# Patient Record
Sex: Male | Born: 1935 | Race: White | Hispanic: No | Smoking: Former smoker
Health system: Southern US, Community
[De-identification: ages and names within clinical notes are randomized; demographics above are authoritative.]

## PROBLEM LIST (undated history)

## (undated) DIAGNOSIS — E079 Disorder of thyroid, unspecified: Secondary | ICD-10-CM

## (undated) DIAGNOSIS — C801 Malignant (primary) neoplasm, unspecified: Secondary | ICD-10-CM

## (undated) DIAGNOSIS — I10 Essential (primary) hypertension: Secondary | ICD-10-CM

## (undated) DIAGNOSIS — H9190 Unspecified hearing loss, unspecified ear: Secondary | ICD-10-CM

## (undated) DIAGNOSIS — E039 Hypothyroidism, unspecified: Secondary | ICD-10-CM

## (undated) DIAGNOSIS — M1990 Unspecified osteoarthritis, unspecified site: Secondary | ICD-10-CM

## (undated) HISTORY — PX: PROSTATE SURGERY: SHX751

## (undated) HISTORY — PX: CHOLECYSTECTOMY: SHX55

---

## 2009-06-14 ENCOUNTER — Ambulatory Visit: Payer: Self-pay | Admitting: Urology

## 2009-06-29 ENCOUNTER — Ambulatory Visit: Payer: Self-pay | Admitting: Urology

## 2010-09-27 ENCOUNTER — Ambulatory Visit: Payer: Self-pay | Admitting: Urology

## 2010-10-04 LAB — PATHOLOGY REPORT

## 2012-05-01 DIAGNOSIS — N400 Enlarged prostate without lower urinary tract symptoms: Secondary | ICD-10-CM | POA: Insufficient documentation

## 2013-11-05 DIAGNOSIS — E785 Hyperlipidemia, unspecified: Secondary | ICD-10-CM | POA: Insufficient documentation

## 2013-11-05 DIAGNOSIS — N289 Disorder of kidney and ureter, unspecified: Secondary | ICD-10-CM | POA: Insufficient documentation

## 2013-11-05 DIAGNOSIS — I10 Essential (primary) hypertension: Secondary | ICD-10-CM | POA: Insufficient documentation

## 2013-11-05 DIAGNOSIS — E039 Hypothyroidism, unspecified: Secondary | ICD-10-CM | POA: Insufficient documentation

## 2013-11-05 DIAGNOSIS — E038 Other specified hypothyroidism: Secondary | ICD-10-CM | POA: Insufficient documentation

## 2015-03-03 DIAGNOSIS — Z87448 Personal history of other diseases of urinary system: Secondary | ICD-10-CM | POA: Insufficient documentation

## 2015-05-11 DIAGNOSIS — E034 Atrophy of thyroid (acquired): Secondary | ICD-10-CM | POA: Insufficient documentation

## 2016-06-25 DIAGNOSIS — R319 Hematuria, unspecified: Secondary | ICD-10-CM | POA: Insufficient documentation

## 2016-07-02 ENCOUNTER — Other Ambulatory Visit: Payer: Self-pay | Admitting: Internal Medicine

## 2016-07-02 ENCOUNTER — Ambulatory Visit
Admission: RE | Admit: 2016-07-02 | Discharge: 2016-07-02 | Disposition: A | Discharge status: Home / Self Care | Payer: Medicare Other | Source: Ambulatory Visit | Attending: Internal Medicine | Admitting: Internal Medicine

## 2016-07-02 DIAGNOSIS — R600 Localized edema: Secondary | ICD-10-CM | POA: Diagnosis not present

## 2016-07-24 ENCOUNTER — Encounter: Payer: Self-pay | Admitting: Emergency Medicine

## 2016-07-24 ENCOUNTER — Emergency Department
Admission: EM | Admit: 2016-07-24 | Discharge: 2016-07-24 | Disposition: A | Discharge status: Home / Self Care | Payer: Medicare Other | Attending: Emergency Medicine | Admitting: Emergency Medicine

## 2016-07-24 DIAGNOSIS — I10 Essential (primary) hypertension: Secondary | ICD-10-CM | POA: Diagnosis not present

## 2016-07-24 DIAGNOSIS — R339 Retention of urine, unspecified: Secondary | ICD-10-CM

## 2016-07-24 DIAGNOSIS — N3091 Cystitis, unspecified with hematuria: Secondary | ICD-10-CM | POA: Diagnosis not present

## 2016-07-24 DIAGNOSIS — Z79899 Other long term (current) drug therapy: Secondary | ICD-10-CM | POA: Diagnosis not present

## 2016-07-24 DIAGNOSIS — N3090 Cystitis, unspecified without hematuria: Secondary | ICD-10-CM

## 2016-07-24 DIAGNOSIS — R310 Gross hematuria: Secondary | ICD-10-CM

## 2016-07-24 HISTORY — DX: Disorder of thyroid, unspecified: E07.9

## 2016-07-24 HISTORY — DX: Essential (primary) hypertension: I10

## 2016-07-24 LAB — URINALYSIS, COMPLETE (UACMP) WITH MICROSCOPIC
BACTERIA UA: NONE SEEN
Squamous Epithelial / LPF: NONE SEEN

## 2016-07-24 LAB — COMPREHENSIVE METABOLIC PANEL
ALT: 13 U/L — AB (ref 17–63)
ANION GAP: 9 (ref 5–15)
AST: 33 U/L (ref 15–41)
Albumin: 3.5 g/dL (ref 3.5–5.0)
Alkaline Phosphatase: 46 U/L (ref 38–126)
BUN: 19 mg/dL (ref 6–20)
CHLORIDE: 98 mmol/L — AB (ref 101–111)
CO2: 22 mmol/L (ref 22–32)
Calcium: 8.6 mg/dL — ABNORMAL LOW (ref 8.9–10.3)
Creatinine, Ser: 1.19 mg/dL (ref 0.61–1.24)
GFR, EST NON AFRICAN AMERICAN: 55 mL/min — AB (ref >60)
Glucose, Bld: 121 mg/dL — ABNORMAL HIGH (ref 65–99)
Potassium: 3.6 mmol/L (ref 3.5–5.1)
Sodium: 129 mmol/L — ABNORMAL LOW (ref 135–145)
Total Bilirubin: 1.3 mg/dL — ABNORMAL HIGH (ref 0.3–1.2)
Total Protein: 6.2 g/dL — ABNORMAL LOW (ref 6.5–8.1)

## 2016-07-24 LAB — CBC
HEMATOCRIT: 27.7 % — AB (ref 40.0–52.0)
Hemoglobin: 9.2 g/dL — ABNORMAL LOW (ref 13.0–18.0)
MCH: 29.8 pg (ref 26.0–34.0)
MCHC: 33.2 g/dL (ref 32.0–36.0)
MCV: 89.6 fL (ref 80.0–100.0)
Platelets: 193 10*3/uL (ref 150–440)
RBC: 3.09 MIL/uL — AB (ref 4.40–5.90)
RDW: 14.2 % (ref 11.5–14.5)
WBC: 7.7 10*3/uL (ref 3.8–10.6)

## 2016-07-24 MED ORDER — CEPHALEXIN 500 MG PO CAPS: 500.0000 mg | ORAL_CAPSULE | Freq: Four times a day (QID) | ORAL | 0 refills | Status: AC

## 2016-07-24 MED ORDER — SODIUM CHLORIDE 0.9 % IV BOLUS (SEPSIS)
500.0000 mL | Freq: Once | INTRAVENOUS | Status: AC
  Administered 2016-07-24: 500 mL via INTRAVENOUS

## 2016-07-24 MED ORDER — CEFTRIAXONE SODIUM-DEXTROSE 1-3.74 GM-% IV SOLR
1.0000 g | Freq: Once | INTRAVENOUS | Status: AC
  Administered 2016-07-24: 1 g via INTRAVENOUS
  Filled 2016-07-24: qty 50

## 2016-07-24 MED ORDER — DEXTROSE 5 % IV SOLN
1.0000 g | Freq: Once | INTRAVENOUS | Status: DC
  Filled 2016-07-24: qty 10

## 2016-07-24 NOTE — ED Provider Notes (Signed)
[facility] Emergency Department Provider Note   ____________________________________________   First MD Initiated Contact with Patient 07/24/16 0028     (approximate)  I have reviewed the triage vital signs and the nursing notes.   HISTORY  Chief Complaint Hematuria    HPI Nicholas Grimes is a 83 y.o. male who comes into the hospital today with hematuria. The patient had a catheter for a week after having some hematuria a few weeks ago. He reports that after seeing the urologist's office he was given the option to remove the catheter and to self catheter. The patient has been self cathetering but reports today he noticed some blood coming from his catheter. He reports that he is also having pain in his abdomen as well as in his penis. He it hurts when he urinates very little comes out. He feels as though he has to urinate. The patient called his nephew tonight. He was seen at [facility] 3 weeks ago and he had a procedure to help cauterize the bleeding. Tonight after the patient contacted his nephew he has been dribbling blood. The nephew didn't feel like he could bring the patient in for himself so he called 911. The patient has been seen at Dr. Dene Gentry office.He was scheduled for a bladder scan in the morning. The patient is here today for evaluation. The patient has otherwise been doing well. He has no chest pain or shortness of breath, he has no dizziness or lightheadedness. He rates his pain 8-9 out of 10 in intensity.   Past Medical History:  Diagnosis Date  . Hypertension   . Thyroid disease     There are no active problems to display for this patient.   Past Surgical History:  Procedure Laterality Date  . CHOLECYSTECTOMY    . PROSTATE SURGERY      Prior to Admission medications   Medication Sig Start Date End Date Taking? Authorizing Provider  doxazosin (CARDURA) 8 MG tablet Take 8 mg by mouth every evening.   Yes Historical Provider,  MD  hydrochlorothiazide (HYDRODIURIL) 25 MG tablet Take 25 mg by mouth daily.   Yes Historical Provider, MD  levothyroxine (SYNTHROID, LEVOTHROID) 75 MCG tablet Take 75 mcg by mouth daily.   Yes Historical Provider, MD  simvastatin (ZOCOR) 20 MG tablet Take 20 mg by mouth every evening.   Yes Historical Provider, MD  cephALEXin (KEFLEX) 500 MG capsule Take 1 capsule (500 mg total) by mouth 4 (four) times daily. 07/24/16 08/03/16  Loney Hering, MD    Allergies Patient has no known allergies.  No family history on file.  Social History Social History  Substance Use Topics  . Smoking status: Former Research scientist (life sciences)  . Smokeless tobacco: Former Systems developer  . Alcohol use No    Review of Systems Constitutional: No fever/chills Eyes: No visual changes. ENT: No sore throat. Cardiovascular: Denies chest pain. Respiratory: Denies shortness of breath. Gastrointestinal: abdominal pain.  No nausea, no vomiting.  No diarrhea.  No constipation. Genitourinary: Hematuria, dysuria, urgency Musculoskeletal: Negative for back pain. Skin: Negative for rash. Neurological: Negative for headaches, focal weakness or numbness.  10-point ROS otherwise negative.  ____________________________________________   PHYSICAL EXAM:  VITAL SIGNS: ED Triage Vitals  Enc Vitals Group     BP 07/24/16 0021 (!) 150/88     Pulse --      Resp 07/24/16 0021 18     Temp 07/24/16 0021 98.9 F (37.2 C)     Temp Source 07/24/16  0021 Oral     SpO2 07/24/16 0021 97 %     Weight 07/24/16 0022 174 lb (78.9 kg)     Height 07/24/16 0022 5\' 11"  (1.803 m)     Head Circumference --      Peak Flow --      Pain Score 07/24/16 0044 8     Pain Loc --      Pain Edu? --      Excl. in [place]? --     Constitutional: Alert and oriented. Well appearing and in Moderate distress. Eyes: Conjunctivae are normal. PERRL. EOMI. Head: Atraumatic. Nose: No congestion/rhinnorhea. Mouth/Throat: Mucous membranes are moist.  Oropharynx  non-erythematous. Cardiovascular: Normal rate, regular rhythm. Grossly normal heart sounds.  Good peripheral circulation. Respiratory: Normal respiratory effort.  No retractions. Lungs CTAB. Gastrointestinal: Soft with some suprapubic tenderness to palpation No distention. Positive bowel sounds Musculoskeletal: No lower extremity tenderness nor edema.  Neurologic:  Normal speech and language.  Skin:  Skin is warm, dry and intact.  Psychiatric: Mood and affect are normal.   ____________________________________________   LABS (all labs ordered are listed, but only abnormal results are displayed)  Labs Reviewed  URINALYSIS, COMPLETE (UACMP) WITH MICROSCOPIC - Abnormal; Notable for the following:       Result Value   Color, Urine RED (*)    APPearance CLOUDY (*)    Specific Gravity, Urine >1.030 (*)    Glucose, UA   (*)    Value: TEST NOT REPORTED DUE TO COLOR INTERFERENCE OF URINE PIGMENT   Hgb urine dipstick   (*)    Value: TEST NOT REPORTED DUE TO COLOR INTERFERENCE OF URINE PIGMENT   Bilirubin Urine   (*)    Value: TEST NOT REPORTED DUE TO COLOR INTERFERENCE OF URINE PIGMENT   Ketones, ur   (*)    Value: TEST NOT REPORTED DUE TO COLOR INTERFERENCE OF URINE PIGMENT   Protein, ur   (*)    Value: TEST NOT REPORTED DUE TO COLOR INTERFERENCE OF URINE PIGMENT   Nitrite   (*)    Value: TEST NOT REPORTED DUE TO COLOR INTERFERENCE OF URINE PIGMENT   Leukocytes, UA   (*)    Value: TEST NOT REPORTED DUE TO COLOR INTERFERENCE OF URINE PIGMENT   All other components within normal limits  CBC - Abnormal; Notable for the following:    RBC 3.09 (*)    Hemoglobin 9.2 (*)    HCT 27.7 (*)    All other components within normal limits  COMPREHENSIVE METABOLIC PANEL - Abnormal; Notable for the following:    Sodium 129 (*)    Chloride 98 (*)    Glucose, Bld 121 (*)    Calcium 8.6 (*)    Total Protein 6.2 (*)    ALT 13 (*)    Total Bilirubin 1.3 (*)    GFR calc non Af Amer 55 (*)    All  other components within normal limits  URINE CULTURE   ____________________________________________  EKG  none ____________________________________________  RADIOLOGY  none ____________________________________________   PROCEDURES  Procedure(s) performed: None  Procedures  Critical Care performed: No  ____________________________________________   INITIAL IMPRESSION / ASSESSMENT AND PLAN / ED COURSE  Pertinent labs & imaging results that were available during my care of the patient were reviewed by me and considered in my medical decision making (see chart for details).  This is an 83 year old who comes into the hospital today with hematuria. We'll check the patient's urine as well  as a CBC and a CMP. I will review the patient's chart to determine what procedure was done while he was at [facility]. I will reassess the patient. I will have them place another catheter in the patient as well.     The patient had his bladder irrigated. We were able to clear some of the blood from his urine and then it turned back into blood-tinged urine. Since he does have white blood cells and red blood cells in his urine I will treat him for possible infection. As the patient has been performing intermittent catheterization there is a chance that this could be due to infection. The patient has an appointment with his urologist today at 10:30. I feel that he can be discharged to follow-up with his urologist. The patient's hemoglobin is 9.2 with a hematocrit of 27.7. The patient's last hemoglobin was done on 3/13 and it was 9.6 and 29. I discussed this with the patient's nephew who is with him. The patient will be discharged to follow-up today. The patient has been encouraged to drink lots of water to help clear his urine. ____________________________________________   FINAL CLINICAL IMPRESSION(S) / ED DIAGNOSES  Final diagnoses:  Gross hematuria  Cystitis  Urinary retention       NEW MEDICATIONS STARTED DURING THIS VISIT:  New Prescriptions   CEPHALEXIN (KEFLEX) 500 MG CAPSULE    Take 1 capsule (500 mg total) by mouth 4 (four) times daily.     Note:  This document was prepared using Dragon voice recognition software and may include unintentional dictation errors.    Loney Hering, MD 07/24/16 [phone]

## 2016-07-24 NOTE — ED Notes (Signed)
Pt discharged to home.  Family member driving.  Discharge instructions reviewed.  Verbalized understanding.  No questions or concerns at this time.  Teach back verified.  Pt in NAD.  No items left in ED.   

## 2016-07-24 NOTE — ED Notes (Signed)
Leg bag placed on patient, patient verbalized understanding of care of catheter.

## 2016-07-24 NOTE — Discharge Instructions (Signed)
After irrigating your bladder your bloody urine has some improvement. It is not purely blood but is blood-tinged urine. We do have white blood cells and red blood cells in urine at this time. I am treating you for an infection but want you to keep your appointment with your urologist today at 10:30.

## 2016-07-24 NOTE — ED Notes (Signed)
Bladder irrigation performed.  Approximately 60cc irrigated into bladder and 300cc of bright red urine with multiple small blood clots removed from catheter.  Catheter flowing well at this time and patient reporting relief.

## 2016-07-24 NOTE — ED Notes (Signed)
Report given to Iris RN 

## 2016-07-24 NOTE — ED Triage Notes (Signed)
Pt arrived via ems from home with complaints of hematuria and penis pain. Pt reports having a folley placed 3 weeks ago. The folley was removed 1 week ago and pt was instructed to in and out cath himself. Pt reports that has been effective until today.

## 2016-07-26 LAB — URINE CULTURE: Culture: 50000 — AB

## 2016-07-27 NOTE — Progress Notes (Signed)
PHARMACY CONSULT NOTE - INITIAL   Pharmacy Consult for ED Culture Results   No Known Allergies  Patient Measurements: Height: 5\' 11"  (180.3 cm) Weight: 174 lb (78.9 kg) IBW/kg (Calculated) : 75.3   Intake/Output from previous day: No intake/output data recorded. Intake/Output from this shift: No intake/output data recorded.  Labs: No results for input(s): WBC, HGB, HCT, PLT, APTT, CREATININE, LABCREA, CREATININE, CREAT24HRUR, MG, PHOS, ALBUMIN, PROT, ALBUMIN, AST, ALT, ALKPHOS, BILITOT, BILIDIR, IBILI in the last 72 hours. Estimated Creatinine Clearance: 50.1 mL/min (by C-G formula based on SCr of 1.19 mg/dL).   Microbiology: Recent Results (from the past 720 hour(s))  Urine culture     Status: Abnormal   Collection Time: 07/24/16  1:33 AM  Result Value Ref Range Status   Specimen Description URINE, RANDOM  Final   Special Requests NONE  Final   Culture 50,000 COLONIES/mL ENTEROCOCCUS FAECALIS (A)  Final   Report Status 07/26/2016 FINAL  Final   Organism ID, Bacteria ENTEROCOCCUS FAECALIS (A)  Final      Susceptibility   Enterococcus faecalis - MIC*    AMPICILLIN <=2 SENSITIVE Sensitive     LEVOFLOXACIN >=8 RESISTANT Resistant     NITROFURANTOIN <=16 SENSITIVE Sensitive     VANCOMYCIN 1 SENSITIVE Sensitive     * 50,000 COLONIES/mL ENTEROCOCCUS FAECALIS    Medical History: Past Medical History:  Diagnosis Date  . Hypertension   . Thyroid disease     Assessment: 83 yo male seen in ED on 4/10 with cystitis and gross hematuria. Patient was discharged on Keflex 500mg  QID x 10 days. Urine cultures showing 50,000 Enterococcus faecalis.   Plan:  Keflex does not cover Enterococcus faecalis. Based on sensitivity results will prescribe Amoxicillin 500mg  TID x 10 days per Dr. Charlotte Crumb.  Patient was contacted and culture results and treatment plan were discussed. Patient was instructed to discontinue Keflex and begin new antibiotic. Patient verbalized understanding.  RX  was called into patient's pharmacy of choice, [facility] on [address], on 4/13 @ 1350.   Pernell Dupre, PharmD, BCPS Clinical Pharmacist 07/27/2016 2:02 PM

## 2016-10-26 ENCOUNTER — Encounter
Admission: RE | Admit: 2016-10-26 | Discharge: 2016-10-26 | Disposition: A | Discharge status: Home / Self Care | Payer: Medicare Other | Source: Ambulatory Visit | Attending: Surgery | Admitting: Surgery

## 2016-10-26 DIAGNOSIS — I10 Essential (primary) hypertension: Secondary | ICD-10-CM | POA: Insufficient documentation

## 2016-10-26 DIAGNOSIS — K4090 Unilateral inguinal hernia, without obstruction or gangrene, not specified as recurrent: Secondary | ICD-10-CM | POA: Insufficient documentation

## 2016-10-26 DIAGNOSIS — Z01818 Encounter for other preprocedural examination: Secondary | ICD-10-CM | POA: Insufficient documentation

## 2016-10-26 DIAGNOSIS — R001 Bradycardia, unspecified: Secondary | ICD-10-CM | POA: Diagnosis not present

## 2016-10-26 HISTORY — DX: Hypothyroidism, unspecified: E03.9

## 2016-10-26 HISTORY — DX: Unspecified osteoarthritis, unspecified site: M19.90

## 2016-10-26 HISTORY — DX: Malignant (primary) neoplasm, unspecified: C80.1

## 2016-10-26 LAB — CBC
HEMATOCRIT: 37.6 % — AB (ref 40.0–52.0)
HEMOGLOBIN: 12.5 g/dL — AB (ref 13.0–18.0)
MCH: 27.8 pg (ref 26.0–34.0)
MCHC: 33.1 g/dL (ref 32.0–36.0)
MCV: 83.8 fL (ref 80.0–100.0)
Platelets: 190 10*3/uL (ref 150–440)
RBC: 4.49 MIL/uL (ref 4.40–5.90)
RDW: 16.7 % — ABNORMAL HIGH (ref 11.5–14.5)
WBC: 5.8 10*3/uL (ref 3.8–10.6)

## 2016-10-26 LAB — COMPREHENSIVE METABOLIC PANEL
ALT: 15 U/L — AB (ref 17–63)
AST: 22 U/L (ref 15–41)
Albumin: 4 g/dL (ref 3.5–5.0)
Alkaline Phosphatase: 52 U/L (ref 38–126)
Anion gap: 6 (ref 5–15)
BILIRUBIN TOTAL: 0.7 mg/dL (ref 0.3–1.2)
BUN: 15 mg/dL (ref 6–20)
CO2: 28 mmol/L (ref 22–32)
CREATININE: 0.97 mg/dL (ref 0.61–1.24)
Calcium: 9.2 mg/dL (ref 8.9–10.3)
Chloride: 106 mmol/L (ref 101–111)
GFR calc Af Amer: >60 mL/min (ref >60)
GFR calc non Af Amer: >60 mL/min (ref >60)
Glucose, Bld: 98 mg/dL (ref 65–99)
POTASSIUM: 4 mmol/L (ref 3.5–5.1)
Sodium: 140 mmol/L (ref 135–145)
TOTAL PROTEIN: 7 g/dL (ref 6.5–8.1)

## 2016-10-26 LAB — DIFFERENTIAL
Basophils Absolute: 0.1 10*3/uL (ref 0–0.1)
Basophils Relative: 1 %
EOS ABS: 0.1 10*3/uL (ref 0–0.7)
Eosinophils Relative: 2 %
LYMPHS ABS: 0.8 10*3/uL — AB (ref 1.0–3.6)
LYMPHS PCT: 14 %
MONOS PCT: 6 %
Monocytes Absolute: 0.3 10*3/uL (ref 0.2–1.0)
NEUTROS PCT: 77 %
Neutro Abs: 4.5 10*3/uL (ref 1.4–6.5)

## 2016-10-26 NOTE — Pre-Procedure Instructions (Signed)
Dr. Rosey Bath made aware of pre-op EKG and requested medical clearance.

## 2016-10-26 NOTE — Patient Instructions (Signed)
  Your procedure is scheduled on: October 30, 2016 [facility]) Report to Same Day Surgery 2nd floor medical mall ([facility] Entrance-take elevator on left to 2nd floor.  Check in with surgery information desk.) To find out your arrival time please call [phone] between 1PM - 3PM on October 29, 2016 (MONDAY)  Remember: Instructions that are not followed completely may result in serious medical risk, up to and including death, or upon the discretion of your surgeon and anesthesiologist your surgery may need to be rescheduled.    _x___ 1. Do not eat food or drink liquids after midnight. No gum chewing or hard candies                                __x__ 2. No Alcohol for 24 hours before or after surgery.   __x__3. No Smoking for 24 prior to surgery.   ____  4. Bring all medications with you on the day of surgery if instructed.    __x__ 5. Notify your doctor if there is any change in your medical condition     (cold, fever, infections).     Do not wear jewelry, make-up, hairpins, clips or nail polish.  Do not wear lotions, powders, or perfumes.  Do not shave 48 hours prior to surgery. Men may shave face and neck.  Do not bring valuables to the hospital.    [facility] is not responsible for any belongings or valuables.               Contacts, dentures or bridgework may not be worn into surgery.  Leave your suitcase in the car. After surgery it may be brought to your room.  For patients admitted to the hospital, discharge time is determined by your treatment team                       Patients discharged the day of surgery will not be allowed to drive home.  You will need someone to drive you home and stay with you the night of your procedure.    Please read over the following fact sheets that you were given:   [facility] Preparing for Surgery and or MRSA Information   _x___ Take the following medication the morning of surgery with a sip of water :  1.  LEVOTHYROXINE  2.  3.  4.  5.  6.  ____Fleets enema or Magnesium Citrate as directed.   _x___ Use CHG Soap or sage wipes as directed on instruction sheet   ____ Use inhalers on the day of surgery and bring to hospital day of surgery  ____ Stop Metformin and Janumet 2 days prior to surgery.    ____ Take 1/2 of usual insulin dose the night before surgery and none on the morning  surgery    _x___ Follow recommendations from Cardiologist, Pulmonologist or PCP regarding          stopping Aspirin, Coumadin, Pllavix ,Eliquis, Effient, or Pradaxa, and Pletal.  X____Stop Anti-inflammatories such as Advil, Aleve, Ibuprofen, Motrin, Naproxen, Naprosyn, Goodies powders or aspirin products. OK to take Tylenol    _x___ Stop supplements until after surgery.  But may continue Vitamin D, Vitamin B, and multivitamin         ____ Bring C-Pap to the hospital.

## 2016-10-29 MED ORDER — CEFAZOLIN SODIUM-DEXTROSE 2-4 GM/100ML-% IV SOLN
2.0000 g | Freq: Once | INTRAVENOUS | Status: AC
  Administered 2016-10-30: 2 g via INTRAVENOUS

## 2016-10-30 ENCOUNTER — Encounter
Admission: RE | Disposition: A | Discharge status: Home / Self Care | Payer: Self-pay | Source: Ambulatory Visit | Attending: Surgery

## 2016-10-30 ENCOUNTER — Ambulatory Visit
Admission: RE | Admit: 2016-10-30 | Discharge: 2016-10-30 | Disposition: A | Discharge status: Home / Self Care | Payer: Medicare Other | Source: Ambulatory Visit | Attending: Surgery | Admitting: Surgery

## 2016-10-30 ENCOUNTER — Ambulatory Visit: Payer: Medicare Other | Admitting: Certified Registered Nurse Anesthetist

## 2016-10-30 DIAGNOSIS — E785 Hyperlipidemia, unspecified: Secondary | ICD-10-CM | POA: Diagnosis not present

## 2016-10-30 DIAGNOSIS — Z87891 Personal history of nicotine dependence: Secondary | ICD-10-CM | POA: Insufficient documentation

## 2016-10-30 DIAGNOSIS — I10 Essential (primary) hypertension: Secondary | ICD-10-CM | POA: Insufficient documentation

## 2016-10-30 DIAGNOSIS — E02 Subclinical iodine-deficiency hypothyroidism: Secondary | ICD-10-CM | POA: Diagnosis not present

## 2016-10-30 DIAGNOSIS — K4090 Unilateral inguinal hernia, without obstruction or gangrene, not specified as recurrent: Secondary | ICD-10-CM | POA: Diagnosis not present

## 2016-10-30 DIAGNOSIS — Z79899 Other long term (current) drug therapy: Secondary | ICD-10-CM | POA: Insufficient documentation

## 2016-10-30 DIAGNOSIS — N400 Enlarged prostate without lower urinary tract symptoms: Secondary | ICD-10-CM | POA: Diagnosis not present

## 2016-10-30 DIAGNOSIS — M810 Age-related osteoporosis without current pathological fracture: Secondary | ICD-10-CM | POA: Diagnosis not present

## 2016-10-30 DIAGNOSIS — Z8249 Family history of ischemic heart disease and other diseases of the circulatory system: Secondary | ICD-10-CM | POA: Insufficient documentation

## 2016-10-30 HISTORY — PX: INGUINAL HERNIA REPAIR: SHX194

## 2016-10-30 HISTORY — PX: INSERTION OF MESH: SHX5868

## 2016-10-30 SURGERY — REPAIR, HERNIA, INGUINAL, ADULT
Anesthesia: General | Laterality: Right

## 2016-10-30 MED ORDER — HYDROCODONE-ACETAMINOPHEN 5-325 MG PO TABS
ORAL_TABLET | ORAL | Status: AC
  Filled 2016-10-30: qty 1

## 2016-10-30 MED ORDER — PHENYLEPHRINE HCL 10 MG/ML IJ SOLN
INTRAMUSCULAR | Status: DC | PRN
  Administered 2016-10-30: 50 ug via INTRAVENOUS

## 2016-10-30 MED ORDER — FENTANYL CITRATE (PF) 100 MCG/2ML IJ SOLN
INTRAMUSCULAR | Status: AC
  Filled 2016-10-30: qty 2

## 2016-10-30 MED ORDER — PROPOFOL 10 MG/ML IV BOLUS
INTRAVENOUS | Status: AC
  Filled 2016-10-30: qty 20

## 2016-10-30 MED ORDER — CEFAZOLIN SODIUM-DEXTROSE 2-4 GM/100ML-% IV SOLN
INTRAVENOUS | Status: AC
  Filled 2016-10-30: qty 100

## 2016-10-30 MED ORDER — ROCURONIUM BROMIDE 100 MG/10ML IV SOLN
INTRAVENOUS | Status: DC | PRN
  Administered 2016-10-30: 30 mg via INTRAVENOUS
  Administered 2016-10-30: 20 mg via INTRAVENOUS

## 2016-10-30 MED ORDER — HYDROCODONE-ACETAMINOPHEN 5-325 MG PO TABS: 1.0000 | ORAL_TABLET | ORAL | 0 refills | Status: DC | PRN

## 2016-10-30 MED ORDER — GLYCOPYRROLATE 0.2 MG/ML IJ SOLN
INTRAMUSCULAR | Status: DC | PRN
  Administered 2016-10-30: 0.2 mg via INTRAVENOUS

## 2016-10-30 MED ORDER — FAMOTIDINE 20 MG PO TABS
20.0000 mg | ORAL_TABLET | Freq: Once | ORAL | Status: AC
  Administered 2016-10-30: 20 mg via ORAL

## 2016-10-30 MED ORDER — HYDROCODONE-ACETAMINOPHEN 5-325 MG PO TABS: 1.0000 | ORAL_TABLET | ORAL | Status: DC | PRN

## 2016-10-30 MED ORDER — ONDANSETRON HCL 4 MG/2ML IJ SOLN: 4.0000 mg | Freq: Once | INTRAMUSCULAR | Status: DC | PRN

## 2016-10-30 MED ORDER — FENTANYL CITRATE (PF) 100 MCG/2ML IJ SOLN
INTRAMUSCULAR | Status: DC | PRN
  Administered 2016-10-30: 50 ug via INTRAVENOUS
  Administered 2016-10-30 (×2): 25 ug via INTRAVENOUS

## 2016-10-30 MED ORDER — LIDOCAINE HCL (PF) 2 % IJ SOLN
INTRAMUSCULAR | Status: AC
  Filled 2016-10-30: qty 2

## 2016-10-30 MED ORDER — SUGAMMADEX SODIUM 200 MG/2ML IV SOLN
INTRAVENOUS | Status: DC | PRN
  Administered 2016-10-30: 158.8 mg via INTRAVENOUS

## 2016-10-30 MED ORDER — PROPOFOL 10 MG/ML IV BOLUS
INTRAVENOUS | Status: DC | PRN
  Administered 2016-10-30: 140 mg via INTRAVENOUS

## 2016-10-30 MED ORDER — FAMOTIDINE 20 MG PO TABS
ORAL_TABLET | ORAL | Status: AC
  Administered 2016-10-30: 20 mg via ORAL
  Filled 2016-10-30: qty 1

## 2016-10-30 MED ORDER — ONDANSETRON HCL 4 MG/2ML IJ SOLN
INTRAMUSCULAR | Status: DC | PRN
  Administered 2016-10-30: 4 mg via INTRAVENOUS

## 2016-10-30 MED ORDER — BUPIVACAINE-EPINEPHRINE (PF) 0.5% -1:200000 IJ SOLN
INTRAMUSCULAR | Status: DC | PRN
  Administered 2016-10-30: 15 mL

## 2016-10-30 MED ORDER — LACTATED RINGERS IV SOLN
INTRAVENOUS | Status: DC
  Administered 2016-10-30: 13:00:00 via INTRAVENOUS

## 2016-10-30 MED ORDER — FENTANYL CITRATE (PF) 100 MCG/2ML IJ SOLN
25.0000 ug | INTRAMUSCULAR | Status: DC | PRN
  Administered 2016-10-30: 25 ug via INTRAVENOUS

## 2016-10-30 MED ORDER — LIDOCAINE HCL (CARDIAC) 20 MG/ML IV SOLN
INTRAVENOUS | Status: DC | PRN
  Administered 2016-10-30: 80 mg via INTRAVENOUS

## 2016-10-30 MED ORDER — DEXAMETHASONE SODIUM PHOSPHATE 10 MG/ML IJ SOLN
INTRAMUSCULAR | Status: DC | PRN
  Administered 2016-10-30: 4 mg via INTRAVENOUS

## 2016-10-30 SURGICAL SUPPLY — 26 items
BLADE SURG 15 STRL LF DISP TIS (BLADE) ×2 IMPLANT
BLADE SURG 15 STRL SS (BLADE) ×2
CANISTER SUCT 1200ML W/VALVE (MISCELLANEOUS) ×4 IMPLANT
CHLORAPREP W/TINT 26ML (MISCELLANEOUS) ×4 IMPLANT
DERMABOND ADVANCED (GAUZE/BANDAGES/DRESSINGS) ×2
DERMABOND ADVANCED .7 DNX12 (GAUZE/BANDAGES/DRESSINGS) ×2 IMPLANT
DRAIN PENROSE 5/8X18 LTX STRL (WOUND CARE) ×4 IMPLANT
DRAPE LAPAROTOMY 77X122 PED (DRAPES) ×4 IMPLANT
ELECT REM PT RETURN 9FT ADLT (ELECTROSURGICAL) ×4
ELECTRODE REM PT RTRN 9FT ADLT (ELECTROSURGICAL) ×2 IMPLANT
GLOVE BIO SURGEON STRL SZ7.5 (GLOVE) ×4 IMPLANT
GOWN STRL REUS W/ TWL LRG LVL3 (GOWN DISPOSABLE) ×6 IMPLANT
GOWN STRL REUS W/TWL LRG LVL3 (GOWN DISPOSABLE) ×6
KIT RM TURNOVER STRD PROC AR (KITS) ×4 IMPLANT
LABEL OR SOLS (LABEL) IMPLANT
MESH SYNTHETIC 4X6 SOFT BARD (Mesh General) ×2 IMPLANT
MESH SYNTHETIC SOFT BARD 4X6 (Mesh General) ×2 IMPLANT
NEEDLE HYPO 25X1 1.5 SAFETY (NEEDLE) ×4 IMPLANT
NS IRRIG 500ML POUR BTL (IV SOLUTION) ×4 IMPLANT
PACK BASIN MINOR ARMC (MISCELLANEOUS) ×4 IMPLANT
SUT CHROMIC 4 0 RB 1X27 (SUTURE) ×4 IMPLANT
SUT MNCRL AB 4-0 PS2 18 (SUTURE) ×4 IMPLANT
SUT SURGILON 0 30 BLK (SUTURE) ×12 IMPLANT
SUT VIC AB 4-0 SH 27 (SUTURE) ×4
SUT VIC AB 4-0 SH 27XANBCTRL (SUTURE) ×4 IMPLANT
SYRINGE 10CC LL (SYRINGE) ×4 IMPLANT

## 2016-10-30 NOTE — OR Nursing (Signed)
Dr. Tamala Julian here to speak with patient and wife.  He is now aware of the need to straight cath after last procedure.  Dr. Tamala Julian agrees to self cath if need be. He feels that the patient would probably do okay if he continues to tolerate liquids.

## 2016-10-30 NOTE — Anesthesia Procedure Notes (Signed)
Procedure Name: Intubation Performed by: Demetrius Charity Pre-anesthesia Checklist: Patient identified, Patient being monitored, Timeout performed, Emergency Drugs available and Suction available Patient Re-evaluated:Patient Re-evaluated prior to induction Oxygen Delivery Method: Circle system utilized Preoxygenation: Pre-oxygenation with 100% oxygen Induction Type: IV induction Ventilation: Mask ventilation without difficulty and Oral airway inserted - appropriate to patient size Laryngoscope Size: Mac and 3 Grade View: Grade I Tube type: Oral Tube size: 7.0 mm Number of attempts: 1 Airway Equipment and Method: Stylet Placement Confirmation: ETT inserted through vocal cords under direct vision,  positive ETCO2 and breath sounds checked- equal and bilateral Secured at: 21 cm Tube secured with: Tape Dental Injury: Teeth and Oropharynx as per pre-operative assessment

## 2016-10-30 NOTE — Anesthesia Preprocedure Evaluation (Signed)
Anesthesia Evaluation  Patient identified by MRN, date of birth, ID band Patient awake    Reviewed: Allergy & Precautions  Airway Mallampati: II       Dental  (+) Teeth Intact   Pulmonary neg pulmonary ROS, former smoker,    breath sounds clear to auscultation       Cardiovascular Exercise Tolerance: Good hypertension, Pt. on home beta blockers  Rhythm:Regular Rate:Normal  ECG ischemic changes?   Neuro/Psych negative neurological ROS  negative psych ROS   GI/Hepatic negative GI ROS, Neg liver ROS,   Endo/Other  Hypothyroidism   Renal/GU negative Renal ROS     Musculoskeletal   Abdominal Normal abdominal exam  (+)   Peds  Hematology   Anesthesia Other Findings   Reproductive/Obstetrics                             Anesthesia Physical Anesthesia Plan  ASA: III  Anesthesia Plan: General   Post-op Pain Management:    Induction: Intravenous  PONV Risk Score and Plan:   Airway Management Planned: LMA and Oral ETT  Additional Equipment:   Intra-op Plan:   Post-operative Plan: Extubation in OR  Informed Consent: I have reviewed the patients History and Physical, chart, labs and discussed the procedure including the risks, benefits and alternatives for the proposed anesthesia with the patient or authorized representative who has indicated his/her understanding and acceptance.     Plan Discussed with: CRNA  Anesthesia Plan Comments:         Anesthesia Quick Evaluation

## 2016-10-30 NOTE — Op Note (Signed)
OPERATIVE REPORT  PREOPERATIVE DIAGNOSIS: right inguinal hernia  POSTOPERATIVE DIAGNOSIS:right  inguinal hernia  PROCEDURE:  right inguinal hernia repair  ANESTHESIA:  General  SURGEON:  Rochel Brome M.D.  INDICATIONS: He reports recent pain and bulging in the right groin. A right inguinal hernia was demonstrated on physical exam and repair is recommended for definitive treatment    With the patient on the operating table in the supine position the right lower quadrant was prepared with clippers and with ChloraPrep and draped in a sterile manner. A transversely oriented suprapubic incision was made and carried down through subcutaneous tissues. Electrocautery was used for hemostasis. The Scarpa's fascia was incised. The external oblique aponeurosis was incised along the course of its fibers to open the external ring and expose the inguinal cord structures. The cord structures were mobilized. A Penrose drain was passed around the cord structures for traction. Cremaster fibers were spread to expose an indirect hernia sac. The sac was dissected free from surrounding structures. A high ligation of the sac was done with a 4-0 Vicryl suture ligature. The sac was excised and was not submitted for pathology. The repair was carried out with 0 Surgilon suturing the conjoined tendon to the shelving edge of the inguinal ligament incorporating transversalis fascia into the repair. The last stitch led to satisfactory narrowing of the internal ring. Bard soft mesh was cut to create an oval shape and was placed over the repair. This was sutured to the repair with interrupted 0 Surgilon sutures and also sutured medially to the deep fascia and on both sides of the internal ring. Next after seeing hemostasis was intact the cord structures were replaced along the floor of the inguinal canal. The cut edges of the external oblique aponeurosis were closed with a running 4-0 Vicryl suture to re-create the external ring. The  deep fascia superior and lateral to the repair site was infiltrated with half percent Sensorcaine with epinephrine. Subcutaneous tissues were also infiltrated. The Scarpa's fascia was closed with interrupted 4-0 Vicryl sutures. The skin was closed with running 4-0 Monocryl subcuticular suture and Dermabond. The testicle remained in the scrotum  The patient appeared to be in satisfactory condition and was prepared for transfer to the recovery room.  Rochel Brome M.D.

## 2016-10-30 NOTE — Transfer of Care (Signed)
Immediate Anesthesia Transfer of Care Note  Patient: Nicholas Grimes  Procedure(s) Performed: Procedure(s): HERNIA REPAIR INGUINAL ADULT (Right)  Patient Location: PACU  Anesthesia Type:General  Level of Consciousness: awake, alert  and oriented  Airway & Oxygen Therapy: Patient Spontanous Breathing and Patient connected to face mask oxygen  Post-op Assessment: Report given to RN and Post -op Vital signs reviewed and stable  Post vital signs: Reviewed and stable  Last Vitals:  Vitals:   10/30/16 1221  BP: (!) 168/87  Pulse: (!) 59  Resp: 16  Temp: 36.7 C    Last Pain:  Vitals:   10/30/16 1221  TempSrc: Oral  PainSc: 5          Complications: No apparent anesthesia complications

## 2016-10-30 NOTE — Anesthesia Post-op Follow-up Note (Cosign Needed)
Anesthesia QCDR form completed.        

## 2016-10-30 NOTE — Discharge Instructions (Addendum)
AMBULATORY SURGERY  DISCHARGE INSTRUCTIONS   1) The drugs that you were given will stay in your system until tomorrow so for the next 24 hours you should not:  A) Drive an automobile B) Make any legal decisions C) Drink any alcoholic beverage   2) You may resume regular meals tomorrow.  Today it is better to start with liquids and gradually work up to solid foods.  You may eat anything you prefer, but it is better to start with liquids, then soup and crackers, and gradually work up to solid foods.   3) Please notify your doctor immediately if you have any unusual bleeding, trouble breathing, redness and pain at the surgery site, drainage, fever, or pain not relieved by medication.    4) Additional Instructions:  drink plenty of fluids.   take stool softeners along with pain medicine.  No heavy lifting.  Follow the instruction from the physician.  If unable to void within 12 hours, either self cath or come to the ER.  Please let Dr. Tamala Julian know if this happens.    Please contact your physician with any problems or Same Day Surgery at [phone], Monday through Friday 6 am to 4 pm, or [place] [facility] number at [phone].

## 2016-10-30 NOTE — Anesthesia Postprocedure Evaluation (Signed)
Anesthesia Post Note  Patient: Nicholas Grimes  Procedure(s) Performed: Procedure(s) (LRB): HERNIA REPAIR INGUINAL ADULT (Right) INSERTION OF MESH  Patient location during evaluation: PACU Anesthesia Type: General Level of consciousness: awake and alert Pain management: pain level controlled Vital Signs Assessment: post-procedure vital signs reviewed and stable Respiratory status: spontaneous breathing, nonlabored ventilation, respiratory function stable and patient connected to nasal cannula oxygen Cardiovascular status: blood pressure returned to baseline and stable Postop Assessment: no signs of nausea or vomiting Anesthetic complications: no     Last Vitals:  Vitals:   10/30/16 1650 10/30/16 1733  BP: (!) 165/75 (!) 160/80  Pulse: (!) 49 (!) 47  Resp: 12 14  Temp: (!) 36.3 C     Last Pain:  Vitals:   10/30/16 1733  TempSrc:   PainSc: 2                  Rahsaan Weakland S

## 2016-10-30 NOTE — H&P (Signed)
  He comes in today prepared for right inguinal hernia repair. He reports no change in overall condition since the office visit.  Lab work reviewed.  He did have cardiology consultation yesterday and appeared to be satisfactory risk were surgery.  Has started on metoprolol.  The right side was marked YES.  I discussed the plan for surgery.

## 2016-10-31 ENCOUNTER — Encounter: Payer: Self-pay | Admitting: Surgery

## 2016-11-01 ENCOUNTER — Encounter: Payer: Self-pay | Admitting: Surgery

## 2017-11-30 ENCOUNTER — Other Ambulatory Visit: Payer: Self-pay

## 2017-11-30 ENCOUNTER — Inpatient Hospital Stay
Admission: EM | Admit: 2017-11-30 | Discharge: 2017-12-02 | DRG: 683 | Disposition: A | Discharge status: Home / Self Care | Payer: Medicare Other | Attending: Internal Medicine | Admitting: Internal Medicine

## 2017-11-30 DIAGNOSIS — I10 Essential (primary) hypertension: Secondary | ICD-10-CM | POA: Diagnosis present

## 2017-11-30 DIAGNOSIS — Z8249 Family history of ischemic heart disease and other diseases of the circulatory system: Secondary | ICD-10-CM

## 2017-11-30 DIAGNOSIS — K5900 Constipation, unspecified: Secondary | ICD-10-CM | POA: Diagnosis present

## 2017-11-30 DIAGNOSIS — Z833 Family history of diabetes mellitus: Secondary | ICD-10-CM

## 2017-11-30 DIAGNOSIS — E871 Hypo-osmolality and hyponatremia: Secondary | ICD-10-CM | POA: Diagnosis present

## 2017-11-30 DIAGNOSIS — Z9049 Acquired absence of other specified parts of digestive tract: Secondary | ICD-10-CM

## 2017-11-30 DIAGNOSIS — N401 Enlarged prostate with lower urinary tract symptoms: Secondary | ICD-10-CM | POA: Diagnosis present

## 2017-11-30 DIAGNOSIS — M1990 Unspecified osteoarthritis, unspecified site: Secondary | ICD-10-CM | POA: Diagnosis present

## 2017-11-30 DIAGNOSIS — N179 Acute kidney failure, unspecified: Secondary | ICD-10-CM | POA: Diagnosis not present

## 2017-11-30 DIAGNOSIS — R319 Hematuria, unspecified: Secondary | ICD-10-CM

## 2017-11-30 DIAGNOSIS — E785 Hyperlipidemia, unspecified: Secondary | ICD-10-CM | POA: Diagnosis present

## 2017-11-30 DIAGNOSIS — N390 Urinary tract infection, site not specified: Secondary | ICD-10-CM | POA: Diagnosis present

## 2017-11-30 DIAGNOSIS — R338 Other retention of urine: Secondary | ICD-10-CM | POA: Diagnosis not present

## 2017-11-30 DIAGNOSIS — Z79890 Hormone replacement therapy (postmenopausal): Secondary | ICD-10-CM

## 2017-11-30 DIAGNOSIS — R310 Gross hematuria: Secondary | ICD-10-CM | POA: Diagnosis present

## 2017-11-30 DIAGNOSIS — T502X5A Adverse effect of carbonic-anhydrase inhibitors, benzothiadiazides and other diuretics, initial encounter: Secondary | ICD-10-CM | POA: Diagnosis present

## 2017-11-30 DIAGNOSIS — E039 Hypothyroidism, unspecified: Secondary | ICD-10-CM | POA: Diagnosis present

## 2017-11-30 DIAGNOSIS — Y92009 Unspecified place in unspecified non-institutional (private) residence as the place of occurrence of the external cause: Secondary | ICD-10-CM

## 2017-11-30 DIAGNOSIS — B952 Enterococcus as the cause of diseases classified elsewhere: Secondary | ICD-10-CM | POA: Diagnosis present

## 2017-11-30 DIAGNOSIS — Z79899 Other long term (current) drug therapy: Secondary | ICD-10-CM

## 2017-11-30 DIAGNOSIS — Z87891 Personal history of nicotine dependence: Secondary | ICD-10-CM

## 2017-11-30 DIAGNOSIS — Z791 Long term (current) use of non-steroidal anti-inflammatories (NSAID): Secondary | ICD-10-CM

## 2017-11-30 DIAGNOSIS — R7989 Other specified abnormal findings of blood chemistry: Secondary | ICD-10-CM

## 2017-11-30 DIAGNOSIS — D62 Acute posthemorrhagic anemia: Secondary | ICD-10-CM | POA: Diagnosis present

## 2017-11-30 DIAGNOSIS — Z23 Encounter for immunization: Secondary | ICD-10-CM

## 2017-11-30 DIAGNOSIS — Z85828 Personal history of other malignant neoplasm of skin: Secondary | ICD-10-CM

## 2017-11-30 DIAGNOSIS — D649 Anemia, unspecified: Secondary | ICD-10-CM | POA: Diagnosis present

## 2017-11-30 LAB — COMPREHENSIVE METABOLIC PANEL
ALT: 16 U/L (ref 0–44)
ANION GAP: 11 (ref 5–15)
AST: 46 U/L — ABNORMAL HIGH (ref 15–41)
Albumin: 4.1 g/dL (ref 3.5–5.0)
Alkaline Phosphatase: 37 U/L — ABNORMAL LOW (ref 38–126)
BUN: 34 mg/dL — ABNORMAL HIGH (ref 8–23)
CHLORIDE: 87 mmol/L — AB (ref 98–111)
CO2: 23 mmol/L (ref 22–32)
Calcium: 8.8 mg/dL — ABNORMAL LOW (ref 8.9–10.3)
Creatinine, Ser: 1.82 mg/dL — ABNORMAL HIGH (ref 0.61–1.24)
GFR, EST AFRICAN AMERICAN: 38 mL/min — AB (ref >60)
GFR, EST NON AFRICAN AMERICAN: 32 mL/min — AB (ref >60)
Glucose, Bld: 134 mg/dL — ABNORMAL HIGH (ref 70–99)
POTASSIUM: 3.8 mmol/L (ref 3.5–5.1)
SODIUM: 121 mmol/L — AB (ref 135–145)
Total Bilirubin: 1.1 mg/dL (ref 0.3–1.2)
Total Protein: 6.4 g/dL — ABNORMAL LOW (ref 6.5–8.1)

## 2017-11-30 LAB — CBC WITH DIFFERENTIAL/PLATELET
Basophils Absolute: 0 10*3/uL (ref 0–0.1)
Basophils Relative: 0 %
EOS ABS: 0 10*3/uL (ref 0–0.7)
EOS PCT: 0 %
HCT: 25 % — ABNORMAL LOW (ref 40.0–52.0)
Hemoglobin: 9 g/dL — ABNORMAL LOW (ref 13.0–18.0)
LYMPHS ABS: 0.4 10*3/uL — AB (ref 1.0–3.6)
Lymphocytes Relative: 5 %
MCH: 32.4 pg (ref 26.0–34.0)
MCHC: 35.9 g/dL (ref 32.0–36.0)
MCV: 90.3 fL (ref 80.0–100.0)
MONO ABS: 0.6 10*3/uL (ref 0.2–1.0)
Monocytes Relative: 7 %
Neutro Abs: 7.7 10*3/uL — ABNORMAL HIGH (ref 1.4–6.5)
Neutrophils Relative %: 88 %
PLATELETS: 198 10*3/uL (ref 150–440)
RBC: 2.77 MIL/uL — AB (ref 4.40–5.90)
RDW: 14.1 % (ref 11.5–14.5)
WBC: 8.8 10*3/uL (ref 3.8–10.6)

## 2017-11-30 LAB — URINALYSIS, COMPLETE (UACMP) WITH MICROSCOPIC
BACTERIA UA: NONE SEEN
Specific Gravity, Urine: 1.02 (ref 1.005–1.030)
Squamous Epithelial / LPF: NONE SEEN (ref 0–5)

## 2017-11-30 LAB — IRON AND TIBC
IRON: 43 ug/dL — AB (ref 45–182)
Saturation Ratios: 15 % — ABNORMAL LOW (ref 17.9–39.5)
TIBC: 283 ug/dL (ref 250–450)
UIBC: 240 ug/dL

## 2017-11-30 LAB — FERRITIN: Ferritin: 46 ng/mL (ref 24–336)

## 2017-11-30 MED ORDER — SODIUM CHLORIDE 0.9 % IV SOLN
1.0000 g | INTRAVENOUS | Status: DC
  Administered 2017-11-30 – 2017-12-01 (×2): 1 g via INTRAVENOUS
  Filled 2017-11-30: qty 1
  Filled 2017-11-30 (×2): qty 10

## 2017-11-30 MED ORDER — SODIUM CHLORIDE 0.9 % IV SOLN: 1.0000 g | Freq: Once | INTRAVENOUS | Status: DC

## 2017-11-30 MED ORDER — SODIUM CHLORIDE 0.9 % IV SOLN
Freq: Once | INTRAVENOUS | Status: AC
Start: 2017-11-30 — End: 2017-11-30
  Administered 2017-11-30: 11:00:00 via INTRAVENOUS

## 2017-11-30 MED ORDER — ACETAMINOPHEN 650 MG RE SUPP: 650.0000 mg | Freq: Four times a day (QID) | RECTAL | Status: DC | PRN

## 2017-11-30 MED ORDER — ADULT MULTIVITAMIN W/MINERALS CH
1.0000 | ORAL_TABLET | Freq: Every day | ORAL | Status: DC
  Administered 2017-11-30 – 2017-12-02 (×3): 1 via ORAL
  Filled 2017-11-30 (×3): qty 1

## 2017-11-30 MED ORDER — GI COCKTAIL ~~LOC~~
30.0000 mL | Freq: Once | ORAL | Status: AC
  Administered 2017-11-30: 30 mL via ORAL
  Filled 2017-11-30: qty 30

## 2017-11-30 MED ORDER — PNEUMOCOCCAL VAC POLYVALENT 25 MCG/0.5ML IJ INJ
0.5000 mL | INJECTION | INTRAMUSCULAR | Status: AC
  Administered 2017-12-02: 0.5 mL via INTRAMUSCULAR
  Filled 2017-11-30: qty 0.5

## 2017-11-30 MED ORDER — FUROSEMIDE 40 MG PO TABS
20.0000 mg | ORAL_TABLET | Freq: Once | ORAL | Status: AC
  Administered 2017-11-30: 20 mg via ORAL
  Filled 2017-11-30: qty 1

## 2017-11-30 MED ORDER — TRAMADOL HCL 50 MG PO TABS
50.0000 mg | ORAL_TABLET | Freq: Four times a day (QID) | ORAL | Status: DC | PRN
  Administered 2017-11-30 – 2017-12-02 (×4): 50 mg via ORAL
  Filled 2017-11-30 (×5): qty 1

## 2017-11-30 MED ORDER — SODIUM CHLORIDE 0.9 % IR SOLN
3000.0000 mL | Status: DC
  Administered 2017-11-30 – 2017-12-01 (×10): 3000 mL

## 2017-11-30 MED ORDER — ACETAMINOPHEN 325 MG PO TABS
650.0000 mg | ORAL_TABLET | Freq: Four times a day (QID) | ORAL | Status: DC | PRN
  Filled 2017-11-30: qty 2

## 2017-11-30 MED ORDER — SODIUM CHLORIDE 0.9 % IV SOLN
INTRAVENOUS | Status: DC
Start: 2017-11-30 — End: 2017-12-02
  Administered 2017-11-30 – 2017-12-01 (×3): via INTRAVENOUS

## 2017-11-30 MED ORDER — TAMSULOSIN HCL 0.4 MG PO CAPS
0.8000 mg | ORAL_CAPSULE | Freq: Every day | ORAL | Status: DC
  Administered 2017-11-30 – 2017-12-01 (×2): 0.8 mg via ORAL
  Filled 2017-11-30 (×2): qty 2

## 2017-11-30 MED ORDER — LEVOTHYROXINE SODIUM 50 MCG PO TABS
75.0000 ug | ORAL_TABLET | Freq: Every day | ORAL | Status: DC
  Administered 2017-12-01 – 2017-12-02 (×2): 75 ug via ORAL
  Filled 2017-11-30 (×2): qty 1

## 2017-11-30 NOTE — ED Notes (Addendum)
Report given to Emily, RN.

## 2017-11-30 NOTE — H&P (Addendum)
[facility] NAME: Nicholas Grimes    MR#:  [id number]  DATE OF BIRTH:  1931-03-01  DATE OF ADMISSION:  11/30/2017  PRIMARY CARE PHYSICIAN: Baxter Hire, MD   REQUESTING/REFERRING PHYSICIAN: Dr. Lenise Arena  CHIEF COMPLAINT:   Chief Complaint  Patient presents with  . Hematuria    HISTORY OF PRESENT ILLNESS:  Nicholas Grimes  is a 84 y.o. male presented with the inability to urinate starting last night.  He states that he has been doing well and has not needed to self catheterize but last night he tried to self catheterize and can only get a spider to her blood out.  This morning same thing unable to self-catheterization.  He came to the ER and a catheter was placed and gross hematuria came out.  Patient did have some abdominal pain in the lower area but was relieved once Foley catheter was placed.  He has had a history of 2 prostate procedures prior.  He has done well without the need for self catheterizations for the past year and a half.  Hospitalist were contacted for evaluation for sodium of 121 and creatinine being elevated to 1.82.  Patient was also having a shaking chill when I saw him.  PAST MEDICAL HISTORY:   Past Medical History:  Diagnosis Date  . Arthritis   . Cancer ([place])    Basal Cell Skin Cancer  . Hypertension   . Hypothyroidism   . Thyroid disease     PAST SURGICAL HISTORY:   Past Surgical History:  Procedure Laterality Date  . CHOLECYSTECTOMY    . INGUINAL HERNIA REPAIR Right 10/30/2016   Procedure: HERNIA REPAIR INGUINAL ADULT;  Surgeon: Leonie Green, MD;  Location: [facility] ORS;  Service: General;  Laterality: Right;  . INSERTION OF MESH  10/30/2016   Procedure: INSERTION OF MESH;  Surgeon: Leonie Green, MD;  Location: [facility] ORS;  Service: General;;  . PROSTATE SURGERY      SOCIAL HISTORY:   Social History   Tobacco Use  . Smoking status: Former Smoker    Packs/day:  0.00    Types: Cigarettes, Cigars  . Smokeless tobacco: Former Network engineer Use Topics  . Alcohol use: No    FAMILY HISTORY:   Family History  Problem Relation Age of Onset  . Diabetes Mother   . CAD Father     DRUG ALLERGIES:  No Known Allergies  REVIEW OF SYSTEMS:  CONSTITUTIONAL: No fever, shaking chill.  No fatigue or weakness.  EYES: No blurred or double vision.  EARS, NOSE, AND THROAT: No tinnitus or ear pain. No sore throat.  Decreased hearing. RESPIRATORY: No cough, shortness of breath, wheezing or hemoptysis.  CARDIOVASCULAR: No chest pain, orthopnea, edema.  GASTROINTESTINAL: No nausea, vomiting, diarrhea.  Some abdominal pain prior to Foley catheter placement.. No blood in bowel movements.  Positive for constipation GENITOURINARY: Positive for hematuria.  ENDOCRINE: No polyuria, nocturia.  Positive for hypothyroidism HEMATOLOGY: No anemia, easy bruising or bleeding SKIN: No rash or lesion. MUSCULOSKELETAL: No joint pain or arthritis.   NEUROLOGIC: No tingling, numbness, weakness.  PSYCHIATRY: No anxiety or depression.   MEDICATIONS AT HOME:   Prior to Admission medications   Medication Sig Start Date End Date Taking? Authorizing Provider  doxazosin (CARDURA) 8 MG tablet Take 8 mg by mouth daily at 6 PM.    Yes [provider]  hydrochlorothiazide (HYDRODIURIL) 25 MG tablet Take 25 mg by mouth daily  after breakfast.    Yes [provider]  levothyroxine (SYNTHROID, LEVOTHROID) 75 MCG tablet Take 75 mcg by mouth daily before breakfast.    Yes [provider]  meloxicam (MOBIC) 15 MG tablet Take 15 mg by mouth daily. 11/12/17  Yes [provider]  Multiple Vitamin (MULTIVITAMIN WITH MINERALS) TABS tablet Take 1 tablet by mouth daily.    Yes [provider]  simvastatin (ZOCOR) 20 MG tablet Take 20 mg by mouth at bedtime.    Yes [provider]      VITAL SIGNS:  Blood pressure (!) 148/75, pulse 66,  temperature 98.6 F (37 C), temperature source Oral, resp. rate 18, height 6\' 1"  (1.854 m), weight 79.4 kg, SpO2 99 %.  PHYSICAL EXAMINATION:  GENERAL:  84 y.o.-year-old patient lying in the bed with no acute distress.  EYES: Pupils equal, round, reactive to light and accommodation. No scleral icterus. Extraocular muscles intact.  HEENT: Head atraumatic, normocephalic. Oropharynx and nasopharynx clear.  NECK:  Supple, no jugular venous distention. No thyroid enlargement, no tenderness.  LUNGS: Normal breath sounds bilaterally, no wheezing, rales,rhonchi or crepitation. No use of accessory muscles of respiration.  CARDIOVASCULAR: S1, S2 normal. No murmurs, rubs, or gallops.  ABDOMEN: Soft, nontender, nondistended. Bowel sounds present. No organomegaly or mass.  EXTREMITIES: Trace pedal edema, no cyanosis, or clubbing.  NEUROLOGIC: Cranial nerves II through XII are intact. Muscle strength 5/5 in all extremities. Sensation intact. Gait not checked.  PSYCHIATRIC: The patient is alert and oriented x 3.  SKIN: No rash, lesion, or ulcer.   LABORATORY PANEL:   CBC Recent Labs  Lab 11/30/17 1014  WBC 8.8  HGB 9.0*  HCT 25.0*  PLT 198   ------------------------------------------------------------------------------------------------------------------  Chemistries  Recent Labs  Lab 11/30/17 1014  NA 121*  K 3.8  CL 87*  CO2 23  GLUCOSE 134*  BUN 34*  CREATININE 1.82*  CALCIUM 8.8*  AST 46*  ALT 16  ALKPHOS 37*  BILITOT 1.1   ------------------------------------------------------------------------------------------------------------------    IMPRESSION AND PLAN:   1.  Hyponatremia.  Give gentle IV fluid hydration.  Not sure if the hyponatremia secondary to the hydrochlorothiazide or the urinary retention.  I will stop the hydrochlorothiazide and monitor. 2.  Acute kidney injury likely secondary to urinary retention.  Gentle IV fluid hydration and monitor 3.  Gross hematuria  and urinary retention.  Continue to monitor hematuria.  Hopefully with hydration this will improve.  Will need urology as outpatient for voiding trial.  Change Cardura over to Flomax.  Since the patient did have a shaking chill I will give empiric Rocephin until urine analysis and urine culture comes back. 4.  Acute blood loss anemia.  Hemoglobin 11/26/17 was 13.0 from [facility] records.  Hemoglobin today 9.0.  Add on iron studies. 5.  Essential hypertension monitor off medication at this point 6.  Hypothyroidism unspecified on levothyroxine 7.  Hyperlipidemia unspecified on Zocor   All the records are reviewed and case discussed with ED provider. Management plans discussed with the patient, family and they are in agreement.  CODE STATUS: Full code  TOTAL TIME TAKING CARE OF THIS PATIENT: 52 minutes, including ACP time   Loletha Grayer M.D on 11/30/2017 at 12:28 PM  Between 7am to 6pm - Pager - [phone]  After 6pm call admission pager [phone]  [facility]  [phone]  CC: Primary care physician; Baxter Hire, MD

## 2017-11-30 NOTE — Consult Note (Signed)
I have been asked to see the patient by Dr. Cordelia Poche, for evaluation and management of hematuria.  History of present illness: 84 year old male with a history of urinary retention who administers clean intermittent catheterization several times during the day for this problem who presented to the emergency department this morning in urinary retention.  The patient had not been able to pass a catheter since 6 PM the night prior.  When he presented to the ER he had severe suprapubic pain.  A 16 French Foley catheter was placed and dark and thick bloody urine was drained.  The patient drained about 800 cc.  We irrigated the Foley catheter in the emergency department and it irrigated to clear.  Several hours later it remained clear.  However, the patient was then noted to have hyponatremia and was admitted to the hospital.  In the hospital the patient developed additional or ongoing bleeding.  At this point, the patient then developed clot retention.  I was then re-consulted for management of this problem.  The patient has had this problem in the past, approximately 1 year ago.  He is a patient of Dr. Dene Gentry.  Prior to this episode the patient was doing quite well.  He denies any fevers or chills.  He denies any suprapubic or flank pain.  He was in his usual state of health.  Review of systems: A 12 point comprehensive review of systems was obtained and is negative unless otherwise stated in the history of present illness.  Patient Active Problem List   Diagnosis Date Noted  . Hyponatremia 11/30/2017    No current facility-administered medications on file prior to encounter.    Current Outpatient Medications on File Prior to Encounter  Medication Sig Dispense Refill  . doxazosin (CARDURA) 8 MG tablet Take 8 mg by mouth daily at 6 PM.     . hydrochlorothiazide (HYDRODIURIL) 25 MG tablet Take 25 mg by mouth daily after breakfast.     . levothyroxine (SYNTHROID, LEVOTHROID) 75 MCG tablet Take  75 mcg by mouth daily before breakfast.     . meloxicam (MOBIC) 15 MG tablet Take 15 mg by mouth daily.  10  . Multiple Vitamin (MULTIVITAMIN WITH MINERALS) TABS tablet Take 1 tablet by mouth daily.     . simvastatin (ZOCOR) 20 MG tablet Take 20 mg by mouth at bedtime.       Past Medical History:  Diagnosis Date  . Arthritis   . Cancer ([place])    Basal Cell Skin Cancer  . Hypertension   . Hypothyroidism   . Thyroid disease     Past Surgical History:  Procedure Laterality Date  . CHOLECYSTECTOMY    . INGUINAL HERNIA REPAIR Right 10/30/2016   Procedure: HERNIA REPAIR INGUINAL ADULT;  Surgeon: Leonie Green, MD;  Location: [facility] ORS;  Service: General;  Laterality: Right;  . INSERTION OF MESH  10/30/2016   Procedure: INSERTION OF MESH;  Surgeon: Leonie Green, MD;  Location: [facility] ORS;  Service: General;;  . PROSTATE SURGERY      Social History   Tobacco Use  . Smoking status: Former Smoker    Packs/day: 0.00    Types: Cigarettes, Cigars  . Smokeless tobacco: Former Network engineer Use Topics  . Alcohol use: No  . Drug use: No    Family History  Problem Relation Age of Onset  . Diabetes Mother   . CAD Father     PE: Vitals:   11/30/17 1030 11/30/17 1200 11/30/17  1230 11/30/17 1318  BP: (!) 157/80 (!) 148/75 (!) 151/77 (!) 157/70  Pulse: 63 66 64 62  Resp: 16 18  20   Temp:    98.1 F (36.7 C)  TempSrc:      SpO2: 100% 99%  99%  Weight:      Height:       Patient appears to be in no acute distress  patient is alert and oriented x3 Atraumatic normocephalic head No cervical or supraclavicular lymphadenopathy appreciated No increased work of breathing, no audible wheezes/rhonchi Regular sinus rhythm/rate Abdomen is soft, nontender, nondistended, no CVA or suprapubic tenderness Lower extremities are symmetric without appreciable edema Grossly neurologically intact No identifiable skin lesions  Recent Labs    11/30/17 1014  WBC 8.8  HGB 9.0*  HCT  25.0*   Recent Labs    11/30/17 1014  NA 121*  K 3.8  CL 87*  CO2 23  GLUCOSE 134*  BUN 34*  CREATININE 1.82*  CALCIUM 8.8*   No results for input(s): LABPT, INR in the last 72 hours. No results for input(s): LABURIN in the last 72 hours. Results for orders placed or performed during the hospital encounter of 07/24/16  Urine culture     Status: Abnormal   Collection Time: 07/24/16  1:33 AM  Result Value Ref Range Status   Specimen Description URINE, RANDOM  Final   Special Requests NONE  Final   Culture 50,000 COLONIES/mL ENTEROCOCCUS FAECALIS (A)  Final   Report Status 07/26/2016 FINAL  Final   Organism ID, Bacteria ENTEROCOCCUS FAECALIS (A)  Final      Susceptibility   Enterococcus faecalis - MIC*    AMPICILLIN <=2 SENSITIVE Sensitive     LEVOFLOXACIN >=8 RESISTANT Resistant     NITROFURANTOIN <=16 SENSITIVE Sensitive     VANCOMYCIN 1 SENSITIVE Sensitive     * 50,000 COLONIES/mL ENTEROCOCCUS FAECALIS    Procedure: The patient 63 French Foley catheter was removed.  I then inserted a 24 French three-way Foley catheter with a coud tip.  Once the catheter was then positioned in 10 cc of sterile water was instilled into the balloon.  I then hand irrigated the patient with approximately 2 L of normal saline evacuating approximately 300 cc of clot.  Once the the patient's bladder was emptied he felt significantly better.  He was then placed on CBI and the reflux on a brisk drip was clear.   Imaging: none  Imp: The patient has with a history of urinary retention on CIC likely created a false passage within his prostate creating significant bleeding and leading to clot retention.  He now has a 24 [place] three-way Foley catheter which is draining clear with continuous bladder irrigation on a brisk drip.  The patient was started on Keflex in the emergency department, for concern of infection.  Recommendations: The patients continuous bladder irrigation should be titrated to pink  and weaned off.  The Foley catheter should remain in place and he should be given a voiding trial in approximately 7 days.  Thank you for involving me in this patient's care, we will continue to follow along while the patient is on CBI. Louis Meckel W

## 2017-11-30 NOTE — Progress Notes (Signed)
Patient ID: Nicholas Grimes, male   DOB: 02/11/1931, 84 y.o.   MRN: [id number]  ACP note  Patient and wife at the bedside  Diagnosis: Hyponatremia, acute kidney injury, gross hematuria with urinary retention, acute blood loss anemia, hypertension, hyperlipidemia.  CODE STATUS discussed.  Patient wishes to be a full code at this point in time.  Plan.  Gentle IV fluid hydration.  Monitor sodium and kidney function.  Monitor hematuria and anemia.  Serial hemoglobins.  How long the patient stays depends on where his labs are tomorrow.  Time spent on ACP discussion 17 minutes Dr. Loletha Grayer

## 2017-11-30 NOTE — Consult Note (Signed)
I was curb sided while in the emergency department for hematuria.  The patient has a history of retention and performs clean intermittent catheterizations on his own.  He is a patient of Dr. Bernardo Heater.  Yesterday was unable to cath starting around 6 PM.  This morning he had pain in his suprapubic region and presents the ED.  At the time of my encounter a 54 French catheter been placed and very dark bloody/old urine immediately returned.  I then hand irrigated him through the 16 French catheter and surprising was able to get quite a bit of sludge.  It irrigated clear through the 16 French catheter, and I recommended that they leave the catheter in as such and give 1 L of saline +20 mg of IV Lasix, monitor him for worsening hematuria, and then discharge him if things remained clear with follow-up in 1 week for a voiding trial.

## 2017-11-30 NOTE — Progress Notes (Signed)
Pt in no acute distress. Wife at bedside. VSS. A&OX4. Foley is beginning to drain yellow urine. Pt refuses ted hose and educated. Pt will use SCDs. Oriented to room. Will continue to monitor.

## 2017-11-30 NOTE — ED Notes (Signed)
Urology at bedside. Dr. Jimmye Norman aware pt output post foley insertion is blood.

## 2017-11-30 NOTE — ED Triage Notes (Signed)
Pt c/o bloody urine with painful urination and rectal pain for the past 2-3 days/. States this happened last year and had to have bladder irrigation and it cleared up.

## 2017-11-30 NOTE — ED Provider Notes (Signed)
[facility] Emergency Department Provider Note       Time seen: ----------------------------------------- 10:02 AM on 11/30/2017 -----------------------------------------   I have reviewed the triage vital signs and the nursing notes.  HISTORY   Chief Complaint Hematuria    HPI Nicholas Grimes is a 84 y.o. male with a history of arthritis, cancer, hypertension, hypothyroidism who presents to the ED for pain with urination and rectal pain for the past 2 to 3 days.  Currently he is having urinary retention, states last year he had this issue which required continuous bladder irrigation.  He denies fevers, chills, back pain, vomiting.  Past Medical History:  Diagnosis Date  . Arthritis   . Cancer ([place])    Basal Cell Skin Cancer  . Hypertension   . Hypothyroidism   . Thyroid disease     There are no active problems to display for this patient.   Past Surgical History:  Procedure Laterality Date  . CHOLECYSTECTOMY    . INGUINAL HERNIA REPAIR Right 10/30/2016   Procedure: HERNIA REPAIR INGUINAL ADULT;  Surgeon: Leonie Green, MD;  Location: [facility] ORS;  Service: General;  Laterality: Right;  . INSERTION OF MESH  10/30/2016   Procedure: INSERTION OF MESH;  Surgeon: Leonie Green, MD;  Location: [facility] ORS;  Service: General;;  . PROSTATE SURGERY      Allergies Patient has no known allergies.  Social History Social History   Tobacco Use  . Smoking status: Former Smoker    Packs/day: 0.00    Types: Cigarettes, Cigars  . Smokeless tobacco: Never Used  Substance Use Topics  . Alcohol use: No  . Drug use: No   Review of Systems Constitutional: Negative for fever. Cardiovascular: Negative for chest pain. Respiratory: Negative for shortness of breath. Gastrointestinal: Negative for abdominal pain, positive for rectal pain and constipation Genitourinary: Positive for hematuria Musculoskeletal: Negative for back pain. Skin: Negative  for rash. Neurological: Negative for headaches, focal weakness or numbness.  All systems negative/normal/unremarkable except as stated in the HPI  ____________________________________________   PHYSICAL EXAM:  VITAL SIGNS: ED Triage Vitals  Enc Vitals Group     BP 11/30/17 0958 (!) 154/75     Pulse Rate 11/30/17 0958 74     Resp 11/30/17 0958 18     Temp 11/30/17 0958 98.6 F (37 C)     Temp Source 11/30/17 0958 Oral     SpO2 11/30/17 0958 100 %     Weight --      Height --      Head Circumference --      Peak Flow --      Pain Score 11/30/17 1002 8     Pain Loc --      Pain Edu? --      Excl. in [place]? --    Constitutional: Alert and oriented. Well appearing and in no distress. Eyes: Conjunctivae are normal. Normal extraocular movements. Cardiovascular: Normal rate, regular rhythm. No murmurs, rubs, or gallops. Respiratory: Normal respiratory effort without tachypnea nor retractions. Breath sounds are clear and equal bilaterally. No wheezes/rales/rhonchi. Gastrointestinal: Soft and nontender. Normal bowel sounds Musculoskeletal: Nontender with normal range of motion in extremities. No lower extremity tenderness nor edema. Neurologic:  Normal speech and language. No gross focal neurologic deficits are appreciated.  Skin:  Skin is warm, dry and intact. No rash noted. Psychiatric: Mood and affect are normal. Speech and behavior are normal.  ____________________________________________  ED COURSE:  As part of my medical  decision making, I reviewed the following data within the [place] History obtained from family if available, nursing notes, old chart and ekg, as well as notes from prior ED visits. Patient presented for urinary retention and hematuria, we will assess with labs and imaging as indicated at this time.  Patient will require Foley catheter placement and possibly irrigation Clinical Course as of Dec 01 1115  Sat Nov 30, 2017  1117 Sodium(!): 121  [JW]  1117 Creatinine(!): 1.82 [JW]    Clinical Course User Index [JW] Earleen Newport, MD   Procedures ____________________________________________   LABS (pertinent positives/negatives)  Labs Reviewed  CBC WITH DIFFERENTIAL/PLATELET - Abnormal; Notable for the following components:      Result Value   RBC 2.77 (*)    Hemoglobin 9.0 (*)    HCT 25.0 (*)    Neutro Abs 7.7 (*)    Lymphs Abs 0.4 (*)    All other components within normal limits  COMPREHENSIVE METABOLIC PANEL - Abnormal; Notable for the following components:   Sodium 121 (*)    Chloride 87 (*)    Glucose, Bld 134 (*)    BUN 34 (*)    Creatinine, Ser 1.82 (*)    Calcium 8.8 (*)    Total Protein 6.4 (*)    AST 46 (*)    Alkaline Phosphatase 37 (*)    GFR calc non Af Amer 32 (*)    GFR calc Af Amer 38 (*)    All other components within normal limits  URINE CULTURE  URINALYSIS, COMPLETE (UACMP) WITH MICROSCOPIC   ____________________________________________  DIFFERENTIAL DIAGNOSIS   Acute urinary retention, UTI, bladder cancer, renal colic, prostatitis  FINAL ASSESSMENT AND PLAN  Acute urinary retention, hyponatremia, hematuria   Plan: The patient had presented for acute urinary retention and hematuria. Patient's labs do indicate hyponatremia as well as acute kidney injury likely from urinary retention.  He was given IV fluids while in the ER, seen by urology and had temporary bladder irrigation which seemed to improve his frank hematuria.  It is unclear why his sodium is so low at this time but again he was given IV fluids.  He would benefit from hospital observation to ensure improvement in his sodium as well as his creatinine.   Laurence Aly, MD   Note: This note was generated in part or whole with voice recognition software. Voice recognition is usually quite accurate but there are transcription errors that can and very often do occur. I apologize for any typographical errors that were  not detected and corrected.     Earleen Newport, MD 11/30/17 [phone]

## 2017-12-01 DIAGNOSIS — E871 Hypo-osmolality and hyponatremia: Secondary | ICD-10-CM | POA: Diagnosis present

## 2017-12-01 DIAGNOSIS — R310 Gross hematuria: Secondary | ICD-10-CM | POA: Diagnosis present

## 2017-12-01 DIAGNOSIS — Z791 Long term (current) use of non-steroidal anti-inflammatories (NSAID): Secondary | ICD-10-CM | POA: Diagnosis not present

## 2017-12-01 DIAGNOSIS — Z85828 Personal history of other malignant neoplasm of skin: Secondary | ICD-10-CM | POA: Diagnosis not present

## 2017-12-01 DIAGNOSIS — D62 Acute posthemorrhagic anemia: Secondary | ICD-10-CM | POA: Diagnosis present

## 2017-12-01 DIAGNOSIS — Z833 Family history of diabetes mellitus: Secondary | ICD-10-CM | POA: Diagnosis not present

## 2017-12-01 DIAGNOSIS — M1990 Unspecified osteoarthritis, unspecified site: Secondary | ICD-10-CM | POA: Diagnosis present

## 2017-12-01 DIAGNOSIS — E039 Hypothyroidism, unspecified: Secondary | ICD-10-CM | POA: Diagnosis present

## 2017-12-01 DIAGNOSIS — Z87891 Personal history of nicotine dependence: Secondary | ICD-10-CM | POA: Diagnosis not present

## 2017-12-01 DIAGNOSIS — T502X5A Adverse effect of carbonic-anhydrase inhibitors, benzothiadiazides and other diuretics, initial encounter: Secondary | ICD-10-CM | POA: Diagnosis present

## 2017-12-01 DIAGNOSIS — Z8249 Family history of ischemic heart disease and other diseases of the circulatory system: Secondary | ICD-10-CM | POA: Diagnosis not present

## 2017-12-01 DIAGNOSIS — R338 Other retention of urine: Secondary | ICD-10-CM | POA: Diagnosis not present

## 2017-12-01 DIAGNOSIS — Y92009 Unspecified place in unspecified non-institutional (private) residence as the place of occurrence of the external cause: Secondary | ICD-10-CM | POA: Diagnosis not present

## 2017-12-01 DIAGNOSIS — Z79899 Other long term (current) drug therapy: Secondary | ICD-10-CM | POA: Diagnosis not present

## 2017-12-01 DIAGNOSIS — Z79890 Hormone replacement therapy (postmenopausal): Secondary | ICD-10-CM | POA: Diagnosis not present

## 2017-12-01 DIAGNOSIS — N401 Enlarged prostate with lower urinary tract symptoms: Secondary | ICD-10-CM | POA: Diagnosis present

## 2017-12-01 DIAGNOSIS — D649 Anemia, unspecified: Secondary | ICD-10-CM | POA: Diagnosis present

## 2017-12-01 DIAGNOSIS — E785 Hyperlipidemia, unspecified: Secondary | ICD-10-CM | POA: Diagnosis present

## 2017-12-01 DIAGNOSIS — N179 Acute kidney failure, unspecified: Secondary | ICD-10-CM | POA: Diagnosis present

## 2017-12-01 DIAGNOSIS — Z9049 Acquired absence of other specified parts of digestive tract: Secondary | ICD-10-CM | POA: Diagnosis not present

## 2017-12-01 DIAGNOSIS — B952 Enterococcus as the cause of diseases classified elsewhere: Secondary | ICD-10-CM | POA: Diagnosis present

## 2017-12-01 DIAGNOSIS — Z23 Encounter for immunization: Secondary | ICD-10-CM | POA: Diagnosis present

## 2017-12-01 DIAGNOSIS — I10 Essential (primary) hypertension: Secondary | ICD-10-CM | POA: Diagnosis present

## 2017-12-01 DIAGNOSIS — N390 Urinary tract infection, site not specified: Secondary | ICD-10-CM | POA: Diagnosis present

## 2017-12-01 DIAGNOSIS — R7989 Other specified abnormal findings of blood chemistry: Secondary | ICD-10-CM | POA: Diagnosis not present

## 2017-12-01 DIAGNOSIS — K5900 Constipation, unspecified: Secondary | ICD-10-CM | POA: Diagnosis present

## 2017-12-01 LAB — CBC
HEMATOCRIT: 19.6 % — AB (ref 40.0–52.0)
HEMOGLOBIN: 7 g/dL — AB (ref 13.0–18.0)
MCH: 32.2 pg (ref 26.0–34.0)
MCHC: 35.9 g/dL (ref 32.0–36.0)
MCV: 89.9 fL (ref 80.0–100.0)
Platelets: 159 10*3/uL (ref 150–440)
RBC: 2.19 MIL/uL — AB (ref 4.40–5.90)
RDW: 14.1 % (ref 11.5–14.5)
WBC: 6.9 10*3/uL (ref 3.8–10.6)

## 2017-12-01 LAB — BASIC METABOLIC PANEL
ANION GAP: 4 — AB (ref 5–15)
BUN: 32 mg/dL — ABNORMAL HIGH (ref 8–23)
CALCIUM: 8.4 mg/dL — AB (ref 8.9–10.3)
CO2: 29 mmol/L (ref 22–32)
Chloride: 94 mmol/L — ABNORMAL LOW (ref 98–111)
Creatinine, Ser: 1.11 mg/dL (ref 0.61–1.24)
GFR calc non Af Amer: 59 mL/min — ABNORMAL LOW (ref >60)
Glucose, Bld: 100 mg/dL — ABNORMAL HIGH (ref 70–99)
POTASSIUM: 3.7 mmol/L (ref 3.5–5.1)
Sodium: 127 mmol/L — ABNORMAL LOW (ref 135–145)

## 2017-12-01 LAB — ABO/RH: ABO/RH(D): A POS

## 2017-12-01 LAB — IRON AND TIBC
IRON: 39 ug/dL — AB (ref 45–182)
SATURATION RATIOS: 16 % — AB (ref 17.9–39.5)
TIBC: 242 ug/dL — AB (ref 250–450)
UIBC: 203 ug/dL

## 2017-12-01 LAB — FERRITIN: Ferritin: 40 ng/mL (ref 24–336)

## 2017-12-01 MED ORDER — ACETAMINOPHEN 325 MG PO TABS
650.0000 mg | ORAL_TABLET | Freq: Once | ORAL | Status: AC
  Administered 2017-12-01: 650 mg via ORAL
  Filled 2017-12-01: qty 2

## 2017-12-01 MED ORDER — SODIUM CHLORIDE 0.9% IV SOLUTION
Freq: Once | INTRAVENOUS | Status: AC
  Administered 2017-12-01: 15:00:00 via INTRAVENOUS

## 2017-12-01 NOTE — Progress Notes (Signed)
[facility] [place] NAME: Nicholas Grimes    MR#:  [id number]  DATE OF BIRTH:  09-13-1930  SUBJECTIVE:  CHIEF COMPLAINT:   Chief Complaint  Patient presents with  . Hematuria   Bloody urine and small blood clot during CBI. REVIEW OF SYSTEMS:  Review of Systems  Constitutional: Positive for malaise/fatigue. Negative for chills and fever.  HENT: Negative for sore throat.   Eyes: Negative for blurred vision and double vision.  Respiratory: Negative for cough, hemoptysis, shortness of breath, wheezing and stridor.   Cardiovascular: Negative for chest pain, palpitations, orthopnea and leg swelling.  Gastrointestinal: Negative for abdominal pain, blood in stool, diarrhea, melena, nausea and vomiting.  Genitourinary: Positive for hematuria. Negative for dysuria and flank pain.  Musculoskeletal: Negative for back pain and joint pain.  Skin: Negative for rash.  Neurological: Negative for dizziness, sensory change, focal weakness, seizures, loss of consciousness, weakness and headaches.  Endo/Heme/Allergies: Negative for polydipsia.  Psychiatric/Behavioral: Negative for depression. The patient is not nervous/anxious.     DRUG ALLERGIES:  No Known Allergies VITALS:  Blood pressure (!) 135/56, pulse (!) 58, temperature 98.6 F (37 C), temperature source Oral, resp. rate 20, height 6\' 1"  (1.854 m), weight 79.4 kg, SpO2 100 %. PHYSICAL EXAMINATION:  Physical Exam  Constitutional: He is oriented to person, place, and time. He appears well-developed. No distress.  HENT:  Head: Normocephalic.  Mouth/Throat: Oropharynx is clear and moist.  Eyes: Pupils are equal, round, and reactive to light. Conjunctivae and EOM are normal. No scleral icterus.  Neck: Normal range of motion. Neck supple. No JVD present. No tracheal deviation present.  Cardiovascular: Normal rate, regular rhythm and normal heart sounds. Exam reveals no gallop.  No murmur  heard. Pulmonary/Chest: Effort normal and breath sounds normal. No respiratory distress. He has no wheezes. He has no rales.  Abdominal: Soft. Bowel sounds are normal. He exhibits no distension. There is no tenderness. There is no rebound.  Musculoskeletal: Normal range of motion. He exhibits no edema or tenderness.  Neurological: He is alert and oriented to person, place, and time. No cranial nerve deficit.  Skin: No rash noted. No erythema.  Psychiatric: He has a normal mood and affect.   LABORATORY PANEL:  Male CBC Recent Labs  Lab 12/01/17 0338  WBC 6.9  HGB 7.0*  HCT 19.6*  PLT 159   ------------------------------------------------------------------------------------------------------------------ Chemistries  Recent Labs  Lab 11/30/17 1014 12/01/17 0338  NA 121* 127*  K 3.8 3.7  CL 87* 94*  CO2 23 29  GLUCOSE 134* 100*  BUN 34* 32*  CREATININE 1.82* 1.11  CALCIUM 8.8* 8.4*  AST 46*  --   ALT 16  --   ALKPHOS 37*  --   BILITOT 1.1  --    RADIOLOGY:  No results found. ASSESSMENT AND PLAN:   1.  Hyponatremia.  Na is improving from 121 to 127. Continue normal saline IV, hold hydrochlorothiazide and follow-up BMP.  2.  Acute kidney injury likely secondary to urinary retention.    Continue normal saline in the follow-up BMP. 3.  Gross hematuria and urinary retention.   The patient had a lot of bloody urine and blood clot during CBI,  better today.  Changed Cardura over to Flomax.    Continue empiric Rocephin, follow urine culture.  Keep Foley catheter for 7 days and to follow-up urologist as outpatient.  4.  Acute blood loss anemia.  Hemoglobin 11/26/17 was 13.0 from [facility] records.  Hemoglobin decreased to 9 yesterday and 7 today.  PRBC transfusion and to follow-up hemoglobin in a.m. 5.  Essential hypertension.  Controlled. 6.  Hypothyroidism unspecified on levothyroxine 7.  Hyperlipidemia unspecified on Zocor  All the records are reviewed and case  discussed with Care Management/Social Worker. Management plans discussed with the patient, family and they are in agreement.  CODE STATUS: Full Code  TOTAL TIME TAKING CARE OF THIS PATIENT: 37 minutes.   More than 50% of the time was spent in counseling/coordination of care: YES  POSSIBLE D/C IN 2 DAYS, DEPENDING ON CLINICAL CONDITION.   Demetrios Loll M.D on 12/01/2017 at 12:56 PM  Between 7am to 6pm - Pager - [phone]  After 6pm go to [email or web address] - [facility]

## 2017-12-01 NOTE — Progress Notes (Signed)
Subjective: CC: Hematuria.  Hx: [name] has required irrigation for obstructing clots on occasion through the night but the urine is crystal clear on CBI at this time.   His Hgb fell from 9.0 to 7.0 since admission.   He is without complaints.   The nurse had irrigated the catheter through the sampling port on the catheter tubing which will not effectively irrigate for clots but can flush the tube enough to resume drainage.   ROS:  Review of Systems  All other systems reviewed and are negative.   Anti-infectives: Anti-infectives (From admission, onward)   Start     Dose/Rate Route Frequency Ordered Stop   11/30/17 1400  cefTRIAXone (ROCEPHIN) 1 g in sodium chloride 0.9 % 100 mL IVPB     1 g 200 mL/hr over 30 Minutes Intravenous Every 24 hours 11/30/17 1212     11/30/17 1230  cefTRIAXone (ROCEPHIN) 1 g in sodium chloride 0.9 % 100 mL IVPB  Status:  Discontinued     1 g 200 mL/hr over 30 Minutes Intravenous  Once 11/30/17 1215 11/30/17 1317      Current Facility-Administered Medications  Medication Dose Route Frequency Provider Last Rate Last Dose  . 0.9 %  sodium chloride infusion (Manually program via Guardrails IV Fluids)   Intravenous Once Demetrios Loll, MD      . 0.9 %  sodium chloride infusion   Intravenous Continuous Loletha Grayer, MD 50 mL/hr at 11/30/17 1800    . acetaminophen (TYLENOL) tablet 650 mg  650 mg Oral Q6H PRN Loletha Grayer, MD       Or  . acetaminophen (TYLENOL) suppository 650 mg  650 mg Rectal Q6H PRN Wieting, Richard, MD      . cefTRIAXone (ROCEPHIN) 1 g in sodium chloride 0.9 % 100 mL IVPB  1 g Intravenous Q24H Loletha Grayer, MD   Stopped at 11/30/17 1649  . levothyroxine (SYNTHROID, LEVOTHROID) tablet 75 mcg  75 mcg Oral QAC breakfast Loletha Grayer, MD   75 mcg at 12/01/17 4193  . multivitamin with minerals tablet 1 tablet  1 tablet Oral Daily Loletha Grayer, MD   1 tablet at 12/01/17 1117  . pneumococcal 23 valent vaccine (PNU-IMMUNE)  injection 0.5 mL  0.5 mL Intramuscular Tomorrow-1000 Wieting, Richard, MD      . sodium chloride irrigation 0.9 % 3,000 mL  3,000 mL Irrigation Continuous Ardis Hughs, MD   3,000 mL at 12/01/17 0629  . tamsulosin (FLOMAX) capsule 0.8 mg  0.8 mg Oral QPC supper Loletha Grayer, MD   0.8 mg at 11/30/17 1759  . traMADol (ULTRAM) tablet 50 mg  50 mg Oral Q6H PRN Loletha Grayer, MD   50 mg at 12/01/17 1118     Objective: Vital signs in last 24 hours: Temp:  [97.8 F (36.6 C)-98.4 F (36.9 C)] 98.4 F (36.9 C) (08/18 0833) Pulse Rate:  [55-66] 62 (08/18 0833) Resp:  [18-20] 18 (08/18 0833) BP: (104-157)/(52-77) 134/56 (08/18 0833) SpO2:  [98 %-100 %] 98 % (08/18 0833)  Intake/Output from previous day: 08/17 0701 - 08/18 0700 In: 28022.5 [P.O.:240; I.V.:682.5; IV Piggyback:100] Out: 79024 [Urine:10470] Intake/Output this shift: Total I/O In: 3140 [P.O.:240; Other:2900] Out: 2300 [Other:2300]   Physical Exam  Constitutional: He appears well-developed and well-nourished.  Abdominal: Soft. There is no tenderness.  Genitourinary:  Genitourinary Comments: Foley in place with crystal clear fluid in tube on fast CBI.  There is one small clot in the tube.   Vitals reviewed.   Lab Results:  Recent Labs    11/30/17 1014 12/01/17 0338  WBC 8.8 6.9  HGB 9.0* 7.0*  HCT 25.0* 19.6*  PLT 198 159   BMET Recent Labs    11/30/17 1014 12/01/17 0338  NA 121* 127*  K 3.8 3.7  CL 87* 94*  CO2 23 29  GLUCOSE 134* 100*  BUN 34* 32*  CREATININE 1.82* 1.11  CALCIUM 8.8* 8.4*   PT/INR No results for input(s): LABPROT, INR in the last 72 hours. ABG No results for input(s): PHART, HCO3 in the last 72 hours.  Invalid input(s): PCO2, PO2  Studies/Results: No results found.  I irrigated the foley with NS and got a few small mature clots but the urine was otherwise clear without indication of active bleeding.    Assessment and Plan: Clot retention with acute blood loss  anemia.   The bleeding has stopped and I irrigated out a few small clots this morning which is probably the last of them.   I have stopped the CBI and he could be discharged home with the foley if the urine remains clear off of CBI either this afternoon or in the morning.       LOS: 0 days    Irine Seal 12/01/[phone] ID: Nicholas Grimes, male   DOB: 08/13/1930, 84 y.o.   MRN: [id number]

## 2017-12-02 LAB — BASIC METABOLIC PANEL
Anion gap: 4 — ABNORMAL LOW (ref 5–15)
BUN: 25 mg/dL — ABNORMAL HIGH (ref 8–23)
CALCIUM: 7.9 mg/dL — AB (ref 8.9–10.3)
CO2: 28 mmol/L (ref 22–32)
CREATININE: 0.96 mg/dL (ref 0.61–1.24)
Chloride: 98 mmol/L (ref 98–111)
GFR calc Af Amer: >60 mL/min (ref >60)
GFR calc non Af Amer: >60 mL/min (ref >60)
Glucose, Bld: 93 mg/dL (ref 70–99)
Potassium: 3.8 mmol/L (ref 3.5–5.1)
SODIUM: 130 mmol/L — AB (ref 135–145)

## 2017-12-02 LAB — HEMOGLOBIN: HEMOGLOBIN: 7.8 g/dL — AB (ref 13.0–18.0)

## 2017-12-02 LAB — URINE CULTURE

## 2017-12-02 MED ORDER — AMOXICILLIN 875 MG PO TABS: 875.0000 mg | ORAL_TABLET | Freq: Two times a day (BID) | ORAL | 0 refills | Status: AC

## 2017-12-02 MED ORDER — TAMSULOSIN HCL 0.4 MG PO CAPS: 0.8000 mg | ORAL_CAPSULE | Freq: Every day | ORAL | 0 refills | Status: DC

## 2017-12-02 NOTE — Discharge Summary (Signed)
[facility] [place] NAME: Nicholas Grimes    MR#:  [id number]  DATE OF BIRTH:  07-12-1930  DATE OF ADMISSION:  11/30/2017   ADMITTING PHYSICIAN: Loletha Grayer, MD  DATE OF DISCHARGE: 12/02/17  PRIMARY CARE PHYSICIAN: Baxter Hire, MD   ADMISSION DIAGNOSIS:  Hyponatremia [E87.1] Acute kidney injury ([place]) [N17.9] Anemia, unspecified type [D64.9] Hematuria, unspecified type [R31.9] DISCHARGE DIAGNOSIS:  Active Problems:   Hyponatremia   Anemia  SECONDARY DIAGNOSIS:   Past Medical History:  Diagnosis Date  . Arthritis   . Cancer ([place])    Basal Cell Skin Cancer  . Hypertension   . Hypothyroidism   . Thyroid disease    HOSPITAL COURSE:   Nicholas Grimes is an 84 year old male with a PMH of HTN, hypothyroidism, BPH presenting to the ED with acute urinary retention (unable to self-catheterize at home) and gross hematuria. In the ED, he was noted to be hyponatremic with a Na of 121 and had an elevated Cr to 1.82. He was admitted for further management.  Acute hyponatremia- felt to be secondary to HCTZ - improved with IVFs - Na increased from 121 on admission to 130 on the day of discharge - HCTZ held on discharge, can consider restarting if needed - recheck bmp as an outpatient  Acute kidney injury- secondary to urinary retention and UTI. Resolved after urinary cath placed. - Cr improved from 1.82 on admission to 0.96 on the day of discharge  Gross hematuria and urinary retention- improved - foley catheter placed and had continuous bladder irrigation - urology consulted- recommended keeping foley in place for 7 days and following up with urologist as outpatient - cardura changed to flomax  Enterococcus UTI- improved. - urine culture on admission grew >100,000 CFU enterococcus - initially on ceftriaxone, changed to amoxicillin after cultures resulted. Discharged on 7 day course of amoxicillin.  Acute blood loss anemia- due to  hematuria.  - s/p 1u pRBCs on 8/18 - hemoglobin stable after transfusion - recheck CBC as an outpatient   DISCHARGE CONDITIONS:  Acute hyponatremia- resolved Acute kidney injury-resolved Gross hematuria and urinary retention-improved Enterococcus UTI Acute blood loss anemia Essential hypertension Hyperlipidemia CONSULTS OBTAINED:  Urology DRUG ALLERGIES:  No Known Allergies DISCHARGE MEDICATIONS:   Allergies as of 12/02/2017   No Known Allergies     Medication List    STOP taking these medications   doxazosin 8 MG tablet Commonly known as:  CARDURA   hydrochlorothiazide 25 MG tablet Commonly known as:  HYDRODIURIL   meloxicam 15 MG tablet Commonly known as:  MOBIC     TAKE these medications   amoxicillin 875 MG tablet Commonly known as:  AMOXIL Take 1 tablet (875 mg total) by mouth 2 (two) times daily for 7 days.   levothyroxine 75 MCG tablet Commonly known as:  SYNTHROID, LEVOTHROID Take 75 mcg by mouth daily before breakfast.   multivitamin with minerals Tabs tablet Take 1 tablet by mouth daily.   simvastatin 20 MG tablet Commonly known as:  ZOCOR Take 20 mg by mouth at bedtime.   tamsulosin 0.4 MG Caps capsule Commonly known as:  FLOMAX Take 2 capsules (0.8 mg total) by mouth daily after supper.        DISCHARGE INSTRUCTIONS:  1.  Follow-up with PCP in 1 to 2 weeks 2.  Follow-up with urology in 1 week for voiding trial 3.  Prescribed amoxicillin x7 days for enterococcus UTI 4.  Cardura switched to  Flomax 5.  Held HCTZ on discharge due to acute hyponatremia.  Recheck BMP as an outpatient and can restart as needed. 6.  Recheck CBC as outpatient DIET:  Cardiac diet DISCHARGE CONDITION:  Stable ACTIVITY:  Activity as tolerated OXYGEN:  Home Oxygen: No.  Oxygen Delivery: room air DISCHARGE LOCATION:  home   If you experience worsening of your admission symptoms, develop shortness of breath, life threatening emergency, suicidal or homicidal  thoughts you must seek medical attention immediately by calling 911 or calling your MD immediately  if symptoms less severe.  You Must read complete instructions/literature along with all the possible adverse reactions/side effects for all the Medicines you take and that have been prescribed to you. Take any new Medicines after you have completely understood and accpet all the possible adverse reactions/side effects.   Please note  You were cared for by a hospitalist during your hospital stay. If you have any questions about your discharge medications or the care you received while you were in the hospital after you are discharged, you can call the unit and asked to speak with the hospitalist on call if the hospitalist that took care of you is not available. Once you are discharged, your primary care physician will handle any further medical issues. Please note that NO REFILLS for any discharge medications will be authorized once you are discharged, as it is imperative that you return to your primary care physician (or establish a relationship with a primary care physician if you do not have one) for your aftercare needs so that they can reassess your need for medications and monitor your lab values.    On the day of Discharge:  VITAL SIGNS:  Blood pressure (!) 143/64, pulse 71, temperature 98.6 F (37 C), temperature source Oral, resp. rate 18, height 6\' 1"  (1.854 m), weight 79.4 kg, SpO2 99 %. PHYSICAL EXAMINATION:  GENERAL:  84 y.o.-year-old patient lying in the bed with no acute distress.  EYES: Pupils equal, round, reactive to light and accommodation. No scleral icterus. Extraocular muscles intact.  HEENT: Head atraumatic, normocephalic. Oropharynx and nasopharynx clear.  NECK:  Supple, no jugular venous distention. No thyroid enlargement, no tenderness.  LUNGS: Normal breath sounds bilaterally, no wheezing, rales,rhonchi or crepitation. No use of accessory muscles of respiration.    CARDIOVASCULAR: S1, S2 normal. No murmurs, rubs, or gallops.  ABDOMEN: Soft, non-tender, non-distended. Bowel sounds present. No organomegaly or mass.  Genitourinary: Foley catheter in place. EXTREMITIES: No pedal edema, cyanosis, or clubbing.  NEUROLOGIC: Cranial nerves II through XII are intact.  No focal weakness. Gait not checked.  PSYCHIATRIC: The patient is alert and oriented x 3.  SKIN: No obvious rash, lesion, or ulcer.  DATA REVIEW:   CBC Recent Labs  Lab 12/01/17 0338 12/02/17 0422  WBC 6.9  --   HGB 7.0* 7.8*  HCT 19.6*  --   PLT 159  --     Chemistries  Recent Labs  Lab 11/30/17 1014  12/02/17 0422  NA 121*   < > 130*  K 3.8   < > 3.8  CL 87*   < > 98  CO2 23   < > 28  GLUCOSE 134*   < > 93  BUN 34*   < > 25*  CREATININE 1.82*   < > 0.96  CALCIUM 8.8*   < > 7.9*  AST 46*  --   --   ALT 16  --   --   ALKPHOS 37*  --   --  BILITOT 1.1  --   --    < > = values in this interval not displayed.     Microbiology Results  Results for orders placed or performed during the hospital encounter of 11/30/17  Urine culture     Status: Abnormal   Collection Time: 11/30/17 10:23 AM  Result Value Ref Range Status   Specimen Description   Final    URINE, RANDOM Performed at [facility], [address] [place], [place] [address]    Special Requests   Final    NONE Performed at [facility], [address] [place], [place] [address]    Culture >=100,000 COLONIES/mL ENTEROCOCCUS FAECALIS (A)  Final   Report Status 12/02/2017 FINAL  Final   Organism ID, Bacteria ENTEROCOCCUS FAECALIS (A)  Final      Susceptibility   Enterococcus faecalis - MIC*    AMPICILLIN <=2 SENSITIVE Sensitive     LEVOFLOXACIN 2 SENSITIVE Sensitive     NITROFURANTOIN <=16 SENSITIVE Sensitive     VANCOMYCIN 1 SENSITIVE Sensitive     * >=100,000 COLONIES/mL ENTEROCOCCUS FAECALIS    RADIOLOGY:  No results found.   Management plans discussed with the patient,  family and they are in agreement.  CODE STATUS: Full Code   TOTAL TIME TAKING CARE OF THIS PATIENT: 35 minutes.    Berna Spare Gaila Engebretsen M.D on 12/02/2017 at 9:29 AM  Between 7am to 6pm - Pager - [phone]  After 6pm go to [email or web address] - Proofreader  [facility]  Office  [phone]  CC: Primary care physician; Baxter Hire, MD   Note: This dictation was prepared with Dragon dictation along with smaller phrase technology. Any transcriptional errors that result from this process are unintentional.

## 2017-12-02 NOTE — Progress Notes (Signed)
Discharge instructions and prescriptions given to pt and pt's wife. IVs removed. Urinary catheter drainage bag changed to leg bag. No questions or concerns from pt at this time. Will assist pt with getting dressed and will be discharged home with wife.

## 2017-12-02 NOTE — Discharge Instructions (Signed)
It was so nice to meet you during your hospitalization!  We are going to discharge you with the foley catheter in place. You should leave it in place until you see the urologist in 1 week. Please schedule an appointment with your urologist for 1 week.  We have changed your Cardura to Flomax. Please take Flomax once daily.  We found that you have a UTI. I have prescribed an antibiotic (Amoxicillin) for this. Please take 1 tablet twice a day for 7 days.  Your sodium was low when you came in. We stopped your hydrochlorothiazide. Please STOP taking this for now. Your primary care doctor may choose to restart this.  We have also STOPPED your Mobic because it can make you bleed more easily. Please do not restart this unless instructed by your primary care doctor.  -Dr. Brett Albino

## 2017-12-03 ENCOUNTER — Telehealth: Payer: Self-pay | Admitting: Urology

## 2017-12-03 ENCOUNTER — Ambulatory Visit (INDEPENDENT_AMBULATORY_CARE_PROVIDER_SITE_OTHER): Payer: Medicare Other

## 2017-12-03 ENCOUNTER — Other Ambulatory Visit: Payer: Self-pay

## 2017-12-03 DIAGNOSIS — R339 Retention of urine, unspecified: Secondary | ICD-10-CM | POA: Diagnosis not present

## 2017-12-03 LAB — PREPARE RBC (CROSSMATCH)

## 2017-12-03 LAB — TYPE AND SCREEN
ABO/RH(D): A POS
Antibody Screen: NEGATIVE
UNIT DIVISION: 0
UNIT DIVISION: 0
Unit division: 0

## 2017-12-03 LAB — BPAM RBC
BLOOD PRODUCT EXPIRATION DATE: 201908292359
BLOOD PRODUCT EXPIRATION DATE: 201909192359
BLOOD PRODUCT EXPIRATION DATE: 201909192359
ISSUE DATE / TIME: 201908181142
UNIT TYPE AND RH: 6200
Unit Type and Rh: 6200
Unit Type and Rh: 6200

## 2017-12-03 NOTE — Telephone Encounter (Signed)
Patient called the office this morning.  He was discharged from the hospital with a catheter. He states that it is leaking and needs some assistance.  I have added him to the nurse schedule for this morning.

## 2017-12-03 NOTE — Progress Notes (Signed)
Pt is present for nurse visit. He is complaining that his leg beg is leaking. Upon examination, no evidence of complications. However, leg bag was replaced.

## 2017-12-04 NOTE — Telephone Encounter (Signed)
App made 

## 2017-12-04 NOTE — Telephone Encounter (Signed)
-----   Message from Ardis Hughs, MD sent at 12/03/2017  5:56 PM EDT ----- Regarding: RE: clot retention Yes - as long as he has reasonably close f/u wihtin the next 6 weeks with stoioff.  Thanks, bh ----- Message ----- From: Benard Halsted Sent: 12/02/2017   8:48 AM EDT To: Ardis Hughs, MD Subject: RE: clot retention                             Can he be on the nurse schedule?  Sharyn Lull ----- Message ----- From: Ardis Hughs, MD Sent: 11/30/2017  11:10 AM EDT To: Abbie Sons, MD, Benard Halsted Subject: clot retention                                 This patient was in the emergency department on Saturday morning, I was curb sided to see him, and irrigated his catheter.   It irrigated clear, he needs a trial of void at the end of the week.  He is a patient of Dr. Bernardo Heater, last seen by him nearly 1 year ago in [place].

## 2017-12-06 ENCOUNTER — Ambulatory Visit (INDEPENDENT_AMBULATORY_CARE_PROVIDER_SITE_OTHER): Payer: Medicare Other

## 2017-12-06 DIAGNOSIS — R339 Retention of urine, unspecified: Secondary | ICD-10-CM | POA: Diagnosis not present

## 2017-12-06 NOTE — Progress Notes (Signed)
Catheter Removal  Patient is present today for a catheter removal.  96ml of water was drained from the balloon. A 24FR foley cath was removed from the bladder no complications were noted . Patient tolerated well.  Preformed by: Cristie Hem, CMA  Follow up/ Additional notes: Return at 1400 for PVR.   Bladder Scan Patient cannot void: 400 ml Performed By: Cristie Hem, CMA  Simple Catheter Placement  Due to urinary retention patient is present today for a foley cath placement.  Patient was cleaned and prepped in a sterile fashion with betadine and lidocaine jelly 2% was instilled into the urethra.  A 18 FR coude catheter was inserted, urine return was noted  440ml, urine was yellow in color.  The balloon was filled with 10cc of sterile water.  A leg bag was attached for drainage. Patient was also given a night bag to take home and was given instruction on how to change from one bag to another.  Patient was given instruction on proper catheter care.  Patient tolerated well, no complications were noted   Preformed by: Cristie Hem, CMA  Additional notes/ Follow up: Follow up with Dr. Bernardo Heater.

## 2017-12-09 DIAGNOSIS — R339 Retention of urine, unspecified: Secondary | ICD-10-CM | POA: Insufficient documentation

## 2017-12-23 ENCOUNTER — Ambulatory Visit (INDEPENDENT_AMBULATORY_CARE_PROVIDER_SITE_OTHER): Payer: Medicare Other

## 2017-12-23 ENCOUNTER — Other Ambulatory Visit: Payer: Self-pay

## 2017-12-23 DIAGNOSIS — R339 Retention of urine, unspecified: Secondary | ICD-10-CM | POA: Diagnosis not present

## 2017-12-23 LAB — BLADDER SCAN AMB NON-IMAGING: Scan Result: 210

## 2017-12-23 NOTE — Progress Notes (Signed)
Catheter Removal  Patient is present today for a catheter removal.  62ml of water was drained from the balloon. A 16FR foley cath was removed from the bladder no complications were noted . Patient tolerated well.  Preformed by: Cristie Hem, CMA  Follow up/ Additional notes: Return at 1400 for pvr.  Pt returned this afternoon for pvr, indicating 272ml. Pt states he has been able to urinate throughout the day. Pt notified he should be okay without catheter, however if problems occur, he should resume self catheterizing.

## 2018-01-15 ENCOUNTER — Ambulatory Visit: Payer: Medicare Other | Admitting: Urology

## 2018-01-15 ENCOUNTER — Encounter: Payer: Self-pay | Admitting: Urology

## 2018-01-15 VITALS — BP 159/71 | HR 98 | Ht 72.0 in | Wt 167.7 lb

## 2018-01-15 DIAGNOSIS — N35912 Unspecified bulbous urethral stricture, male: Secondary | ICD-10-CM

## 2018-01-15 DIAGNOSIS — R339 Retention of urine, unspecified: Secondary | ICD-10-CM | POA: Diagnosis not present

## 2018-01-16 ENCOUNTER — Encounter: Payer: Self-pay | Admitting: Urology

## 2018-01-16 NOTE — Progress Notes (Signed)
01/15/2018 6:42 AM   Nicholas Grimes [birth year] [id number]  Referring provider: Baxter Hire, MD [place], [place] [address]  Chief Complaint  Patient presents with  . 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  He performed periodic self-catheterization and had difficulty with catheterization in mid August and had hematuria.  He was seen in the ED and had a 16 French Foley catheter placed.  He was seen by Dr. Louis Meckel.  He is catheter was irrigated clear.  He subsequently was admitted for hyponatremia.  He had recurrent hematuria and required placement of a three-way Foley catheter and CBI.  It was felt the etiology of his hematuria was false passage with self-catheterization.  His catheter was removed on 8/23.  He had a residual of approximately 200 mL.  He states he is currently voiding without problems and feels he is back to baseline.   PMH: Past Medical History:  Diagnosis Date  . Arthritis   . Cancer ([place])    Basal Cell Skin Cancer  . Hypertension   . Hypothyroidism   . Thyroid disease     Surgical History: Past Surgical History:  Procedure Laterality Date  . CHOLECYSTECTOMY    . INGUINAL HERNIA REPAIR Right 10/30/2016   Procedure: HERNIA REPAIR INGUINAL ADULT;  Surgeon: Leonie Green, MD;  Location: [facility] ORS;  Service: General;  Laterality: Right;  . INSERTION OF MESH  10/30/2016   Procedure: INSERTION OF MESH;  Surgeon: Leonie Green, MD;  Location: [facility] ORS;  Service: General;;  . PROSTATE SURGERY      Home Medications:  Allergies as of 01/15/2018   No Known Allergies     Medication List        Accurate as of 01/15/18 11:59 PM. Always use your most recent med list.          doxazosin 8 MG tablet Commonly known as:  CARDURA   hydrochlorothiazide 25 MG tablet Commonly known as:  HYDRODIURIL   levothyroxine 75 MCG tablet Commonly known as:  SYNTHROID,  LEVOTHROID Take 75 mcg by mouth daily before breakfast.   multivitamin with minerals Tabs tablet Take 1 tablet by mouth daily.   simvastatin 20 MG tablet Commonly known as:  ZOCOR Take 20 mg by mouth at bedtime.   tamsulosin 0.4 MG Caps capsule Commonly known as:  FLOMAX Take 2 capsules (0.8 mg total) by mouth daily after supper.       Allergies: No Known Allergies  Family History: Family History  Problem Relation Age of Onset  . Diabetes Mother   . CAD Father     Social History:  reports that he has quit smoking. His smoking use included cigarettes and cigars. He smoked 0.00 packs per day. He has quit using smokeless tobacco. He reports that he does not drink alcohol or use drugs.  ROS: UROLOGY Frequent Urination?: No Hard to postpone urination?: No Burning/pain with urination?: No Get up at night to urinate?: No Leakage of urine?: No Urine stream starts and stops?: No Trouble starting stream?: No Do you have to strain to urinate?: No Blood in urine?: No Urinary tract infection?: No Sexually transmitted disease?: No Injury to kidneys or bladder?: No Painful intercourse?: No Weak stream?: No Erection problems?: No Penile pain?: No  Gastrointestinal Nausea?: No Vomiting?: No Indigestion/heartburn?: No Diarrhea?: No Constipation?: No  Constitutional Fever: No Night sweats?: No Weight loss?: No Fatigue?: No  Skin Skin  rash/lesions?: No Itching?: No  Eyes Blurred vision?: No Double vision?: No  Ears/Nose/Throat Sore throat?: No Sinus problems?: No  Hematologic/Lymphatic Swollen glands?: No Easy bruising?: No  Cardiovascular Leg swelling?: No Chest pain?: No  Respiratory Cough?: No Shortness of breath?: No  Endocrine Excessive thirst?: No  Musculoskeletal Back pain?: No Joint pain?: No  Neurological Headaches?: No Dizziness?: No  Psychologic Depression?: No Anxiety?: No  Physical Exam: BP (!) 159/71 (BP Location: Left Arm,  Patient Position: Sitting, Cuff Size: Large)   Pulse 98   Ht 6' (1.829 m)   Wt 167 lb 11.2 oz (76.1 kg)   BMI 22.74 kg/m   Constitutional:  Alert and oriented, No acute distress. HEENT: [place] AT, moist mucus membranes.  Trachea midline, no masses. Cardiovascular: No clubbing, cyanosis, or edema. Respiratory: Normal respiratory effort, no increased work of breathing. GI: Abdomen is soft, nontender, nondistended, no abdominal masses GU: No CVA tenderness Lymph: No cervical or inguinal lymphadenopathy. Skin: No rashes, bruises or suspicious lesions. Neurologic: Grossly intact, no focal deficits, moving all 4 extremities. Psychiatric: Normal mood and affect.   Assessment & Plan:   Recent episode hematuria related to catheter trauma.  I recommended scheduling cystoscopy however he declined since he has been doing well.  He was cautioned to contact the office ASAP if he develops worsening voiding symptoms or recurrent gross hematuria.   Return in about 6 months (around 07/17/2018) for Recheck.  Abbie Sons, [place] [address], [place],  [address] [phone]

## 2018-07-25 ENCOUNTER — Ambulatory Visit: Payer: Medicare Other | Admitting: Urology

## 2018-08-15 ENCOUNTER — Ambulatory Visit: Payer: Medicare Other | Admitting: Urology

## 2018-10-01 ENCOUNTER — Ambulatory Visit: Payer: Medicare Other | Admitting: Urology

## 2018-10-07 ENCOUNTER — Other Ambulatory Visit: Payer: Self-pay

## 2018-10-07 ENCOUNTER — Encounter: Payer: Self-pay | Admitting: Urology

## 2018-10-07 ENCOUNTER — Ambulatory Visit: Payer: Medicare Other | Admitting: Urology

## 2018-10-07 VITALS — BP 137/72 | HR 97 | Ht 72.0 in | Wt 167.7 lb

## 2018-10-07 DIAGNOSIS — N35912 Unspecified bulbous urethral stricture, male: Secondary | ICD-10-CM

## 2018-10-07 DIAGNOSIS — R339 Retention of urine, unspecified: Secondary | ICD-10-CM

## 2018-10-07 LAB — URINALYSIS, COMPLETE
Bilirubin, UA: NEGATIVE
Glucose, UA: NEGATIVE
Ketones, UA: NEGATIVE
Nitrite, UA: NEGATIVE
Protein,UA: NEGATIVE
Specific Gravity, UA: 1.02 (ref 1.005–1.030)
Urobilinogen, Ur: 0.2 mg/dL (ref 0.2–1.0)
pH, UA: 7 (ref 5.0–7.5)

## 2018-10-07 LAB — MICROSCOPIC EXAMINATION: WBC, UA: >30 /hpf — AB (ref 0–5)

## 2018-10-07 LAB — BLADDER SCAN AMB NON-IMAGING

## 2018-10-07 NOTE — Addendum Note (Signed)
Addended by: Donalee Citrin on: 10/07/2018 04:01 PM   Modules accepted: Orders

## 2018-10-07 NOTE — Progress Notes (Signed)
10/07/2018 1:21 PM   [name] 1930/07/07 [id number]  Referring provider: Baxter Hire, MD [place],  [place] [address]  Chief Complaint  Patient presents with  . Follow-up    HPI: 85 year old male with a long history of urethral stricture disease after PVP in 2011.  He had a traumatic catheterization in August 2019 with hematuria.  He presents for a 18-month follow-up and states he is doing well.  He does have urinary frequency but states he voids with a good stream.  Denies dysuria or recurrent hematuria.  Denies flank, abdominal, pelvic or scrotal pain.   PMH: Past Medical History:  Diagnosis Date  . Arthritis   . Cancer ([place])    Basal Cell Skin Cancer  . Hypertension   . Hypothyroidism   . Thyroid disease     Surgical History: Past Surgical History:  Procedure Laterality Date  . CHOLECYSTECTOMY    . INGUINAL HERNIA REPAIR Right 10/30/2016   Procedure: HERNIA REPAIR INGUINAL ADULT;  Surgeon: Leonie Green, MD;  Location: [facility];  Service: General;  Laterality: Right;  . INSERTION OF MESH  10/30/2016   Procedure: INSERTION OF MESH;  Surgeon: Leonie Green, MD;  Location: [facility];  Service: General;;  . PROSTATE SURGERY      Home Medications:  Allergies as of 10/07/2018   No Known Allergies     Medication List       Accurate as of October 07, 2018  1:21 PM. If you have any questions, ask your nurse or doctor.        clobetasol ointment 0.05 % Commonly known as: TEMOVATE Apply topically.   doxazosin 8 MG tablet Commonly known as: CARDURA   hydrochlorothiazide 25 MG tablet Commonly known as: HYDRODIURIL   levothyroxine 75 MCG tablet Commonly known as: SYNTHROID Take 75 mcg by mouth daily before breakfast.   multivitamin with minerals Tabs tablet Take 1 tablet by mouth daily.   simvastatin 20 MG tablet Commonly known as: ZOCOR Take 20 mg by mouth at bedtime.   tamsulosin 0.4 MG Caps capsule Commonly  known as: FLOMAX Take 2 capsules (0.8 mg total) by mouth daily after supper.       Allergies: No Known Allergies  Family History: Family History  Problem Relation Age of Onset  . Diabetes Mother   . CAD Father     Social History:  reports that he has quit smoking. His smoking use included cigarettes and cigars. He smoked 0.00 packs per day. He has quit using smokeless tobacco. He reports that he does not drink alcohol or use drugs.  ROS: UROLOGY Frequent Urination?: No Hard to postpone urination?: No Burning/pain with urination?: No Get up at night to urinate?: No Leakage of urine?: No Urine stream starts and stops?: No Trouble starting stream?: No Do you have to strain to urinate?: No Blood in urine?: No Urinary tract infection?: No Sexually transmitted disease?: No Injury to kidneys or bladder?: No Painful intercourse?: No Weak stream?: No Erection problems?: No Penile pain?: No  Gastrointestinal Nausea?: No Vomiting?: No Indigestion/heartburn?: No Diarrhea?: No Constipation?: No  Constitutional Fever: No Night sweats?: No Weight loss?: No Fatigue?: No  Skin Skin rash/lesions?: No Itching?: No  Eyes Blurred vision?: No Double vision?: No  Ears/Nose/Throat Sore throat?: No Sinus problems?: No  Hematologic/Lymphatic Swollen glands?: No Easy bruising?: No  Cardiovascular Leg swelling?: No Chest pain?: No  Respiratory Cough?: No Shortness of breath?: No  Endocrine Excessive thirst?: No  Musculoskeletal Back pain?: No Joint pain?: No  Neurological Headaches?: No Dizziness?: No  Psychologic Depression?: No Anxiety?: No  Physical Exam: BP 137/72 (BP Location: Left Arm, Patient Position: Sitting, Cuff Size: Normal)   Pulse 97   Ht 6' (1.829 m)   Wt 167 lb 11.2 oz (76.1 kg)   BMI 22.74 kg/m   Constitutional:  Alert and oriented, No acute distress. HEENT: [place] AT, moist mucus membranes.  Trachea midline, no masses. Cardiovascular:  No clubbing, cyanosis, or edema. Respiratory: Normal respiratory effort, no increased work of breathing. GI: Abdomen is soft, nontender, nondistended, no abdominal masses GU: No CVA tenderness Lymph: No cervical or inguinal lymphadenopathy. Skin: No rashes, bruises or suspicious lesions. Neurologic: Grossly intact, no focal deficits, moving all 4 extremities. Psychiatric: Normal mood and affect.  Laboratory Data:  Urinalysis Dipstick 2+ leukocytes trace blood, nitrite negative Microscopy >30 WBC  Assessment & Plan:   85 year old male with history of urethral stricture disease and mild incomplete emptying.  He has asymptomatic pyuria.  We will continue to monitor and recommend a 57-month follow-up with PVR and repeat UA.  He was instructed to call earlier for recurrent hematuria or worsening voiding symptoms.   Abbie Sons, [facility] [address] [place], [place] [address] [phone]

## 2018-12-06 IMAGING — US US EXTREM LOW VENOUS*L*
1 series · 13 of 24 positions shown · non-contrast
Comparison: No prior.

CLINICAL DATA: Edema.



[Series 1: us extrem low venous*left* · 0.08mm/px · 13 of 37 slices shown]
[im 1/37]
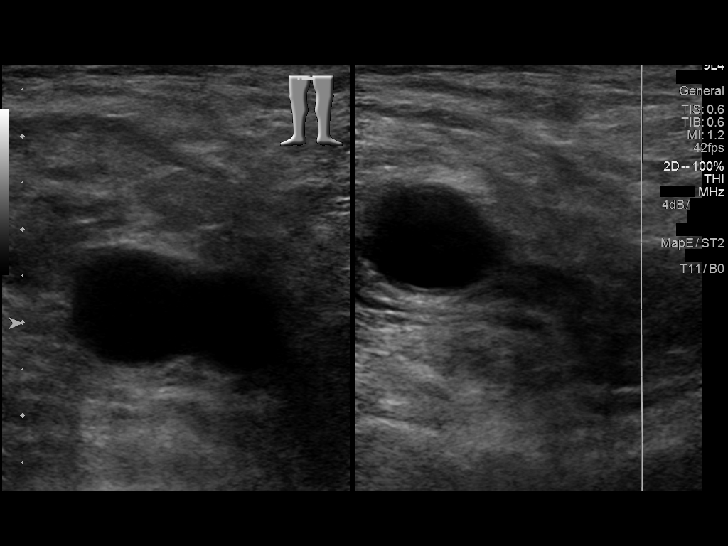
[im 4/37]
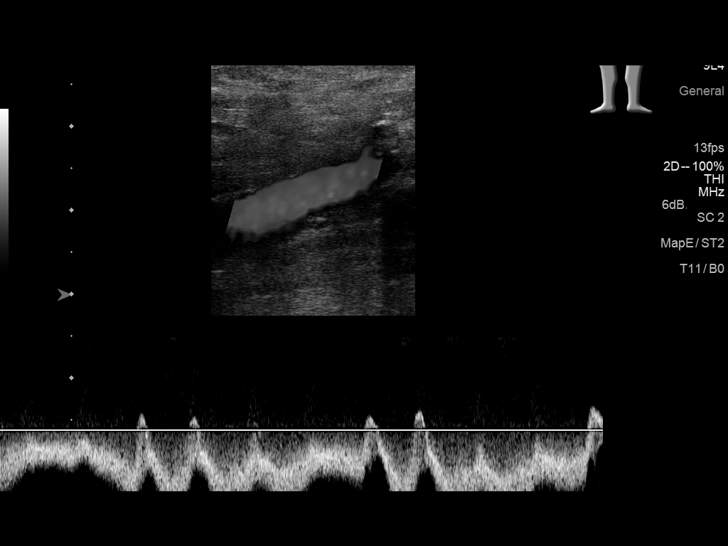
[im 7/37]
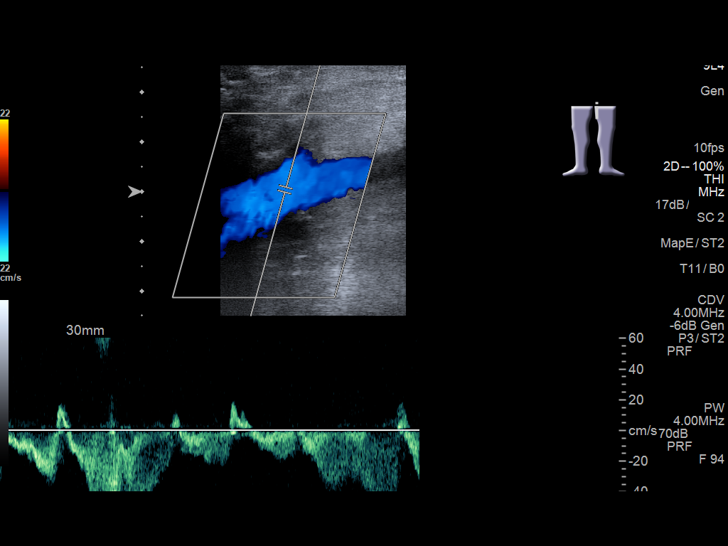
[im 10/37]
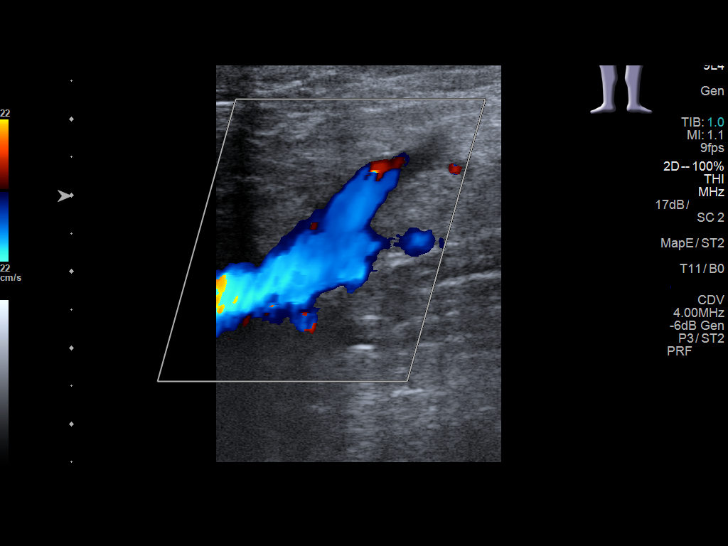
[im 13/37]
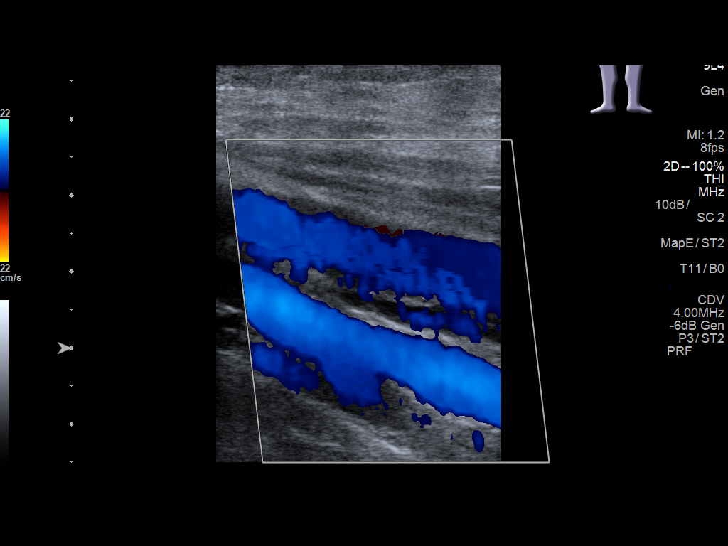
[im 16/37]
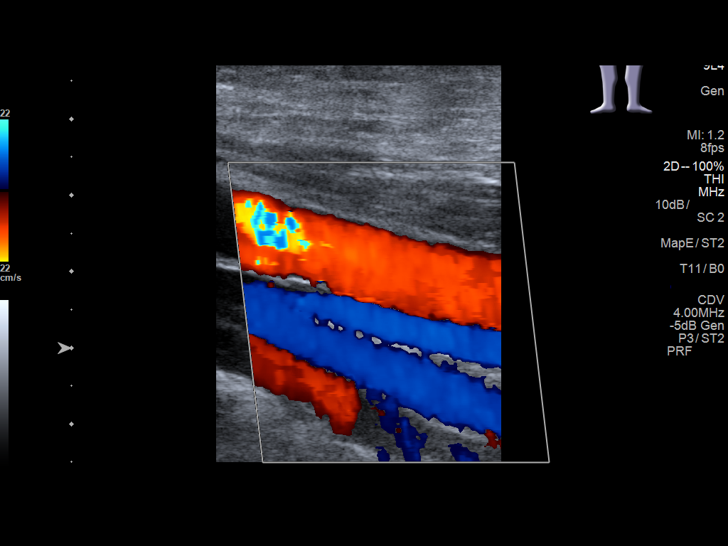
[im 19/37]
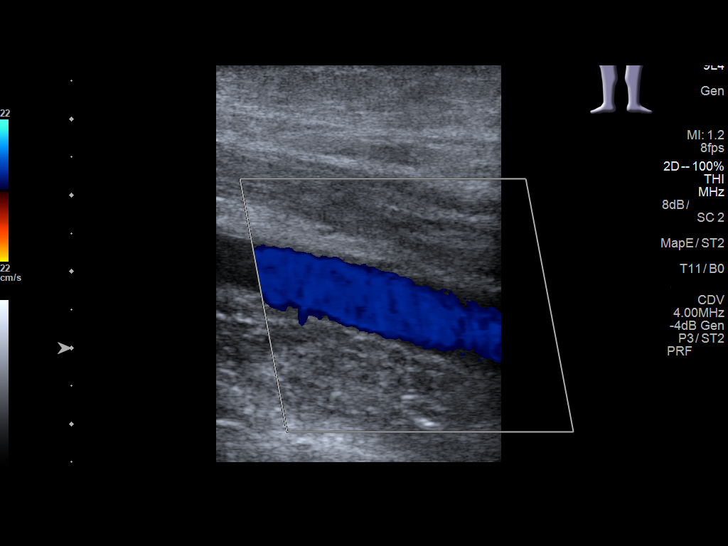
[im 21/37]
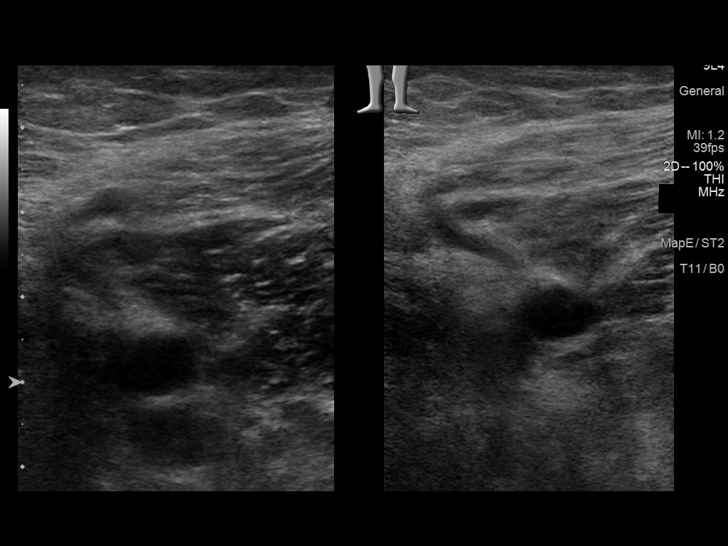
[im 24/37]
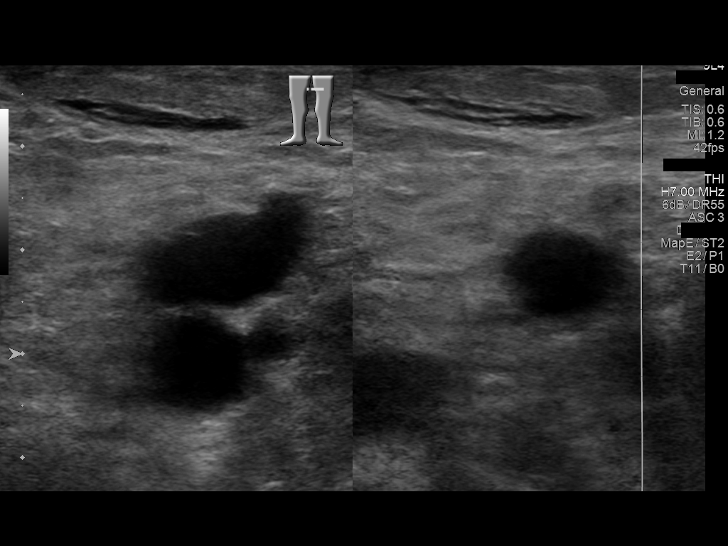
[im 27/37]
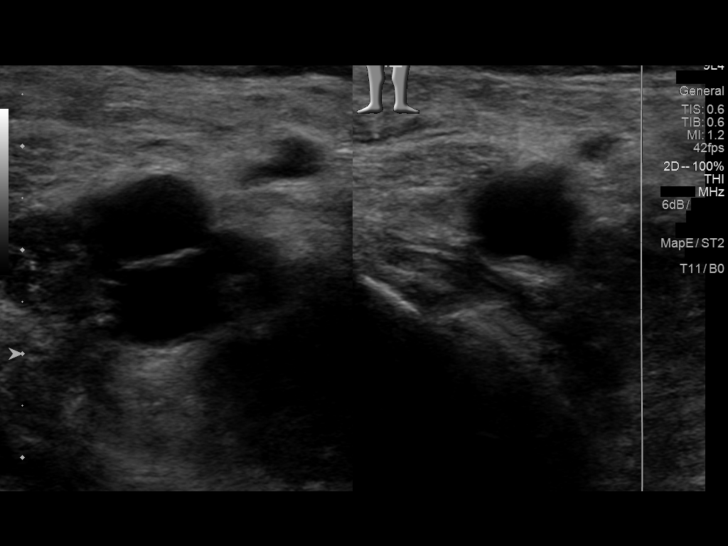
[im 30/37]
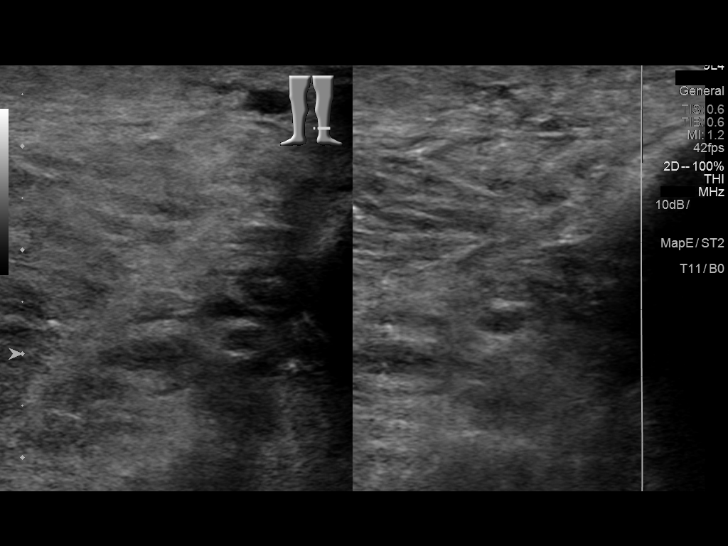
[im 33/37]
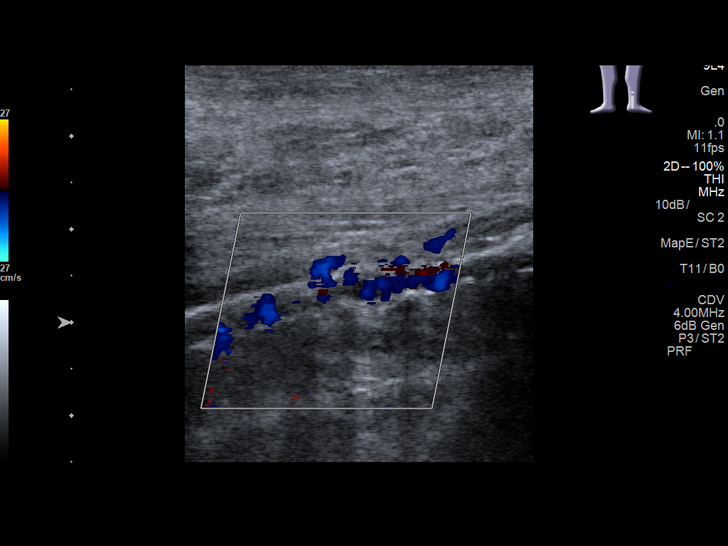
[im 37/37]
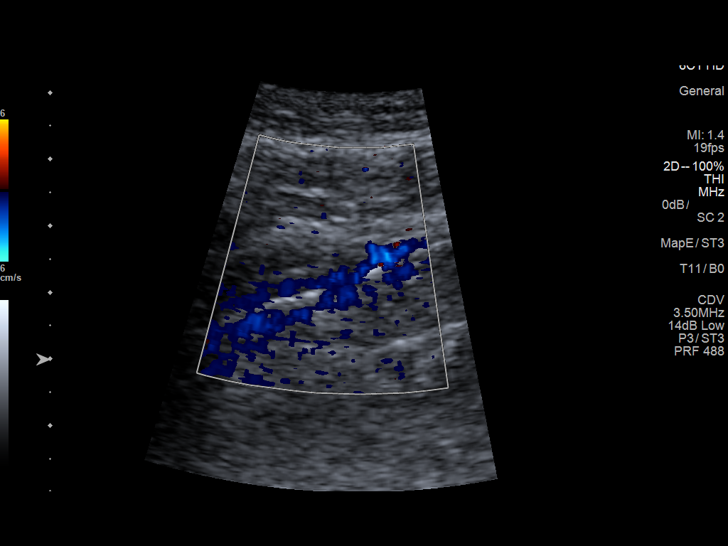

[13 of 24 positions shown; findings below may reference images not displayed]

FINDINGS: Contralateral Common Femoral Vein: Respiratory phasicity is normal
and symmetric with the symptomatic side. No evidence of thrombus.
Normal compressibility.

Common Femoral Vein: No evidence of thrombus. Normal
compressibility, respiratory phasicity and response to augmentation.

Saphenofemoral Junction: No evidence of thrombus. Normal
compressibility and flow on color Doppler imaging.

Profunda Femoral Vein: No evidence of thrombus. Normal
compressibility and flow on color Doppler imaging.

Femoral Vein: No evidence of thrombus. Normal compressibility,
respiratory phasicity and response to augmentation.

Popliteal Vein: No evidence of thrombus. Normal compressibility,
respiratory phasicity and response to augmentation.

Calf Veins: Limited visualization. No evidence of thrombus. Normal
compressibility and flow on color Doppler imaging.

Superficial Great Saphenous Vein: No evidence of thrombus. Normal
compressibility and flow on color Doppler imaging.

Other Findings:  None.
IMPRESSION: No evidence of deep venous thrombosis.

## 2019-04-06 ENCOUNTER — Ambulatory Visit: Payer: Medicare Other | Admitting: Urology

## 2019-04-14 ENCOUNTER — Ambulatory Visit: Payer: Medicare Other | Admitting: Urology

## 2019-04-15 ENCOUNTER — Ambulatory Visit: Payer: Medicare Other | Admitting: Urology

## 2019-07-13 ENCOUNTER — Encounter: Payer: Self-pay | Admitting: Physician Assistant

## 2019-07-13 ENCOUNTER — Ambulatory Visit: Payer: Medicare Other | Admitting: Physician Assistant

## 2019-07-13 ENCOUNTER — Other Ambulatory Visit: Payer: Self-pay

## 2019-07-13 VITALS — BP 171/99 | HR 96 | Ht 73.0 in | Wt 170.0 lb

## 2019-07-13 DIAGNOSIS — R300 Dysuria: Secondary | ICD-10-CM | POA: Diagnosis not present

## 2019-07-13 DIAGNOSIS — R350 Frequency of micturition: Secondary | ICD-10-CM | POA: Diagnosis not present

## 2019-07-13 DIAGNOSIS — N35912 Unspecified bulbous urethral stricture, male: Secondary | ICD-10-CM

## 2019-07-13 LAB — URINALYSIS, COMPLETE
Bilirubin, UA: NEGATIVE
Glucose, UA: NEGATIVE
Ketones, UA: NEGATIVE
Nitrite, UA: NEGATIVE
Specific Gravity, UA: 1.015 (ref 1.005–1.030)
Urobilinogen, Ur: 0.2 mg/dL (ref 0.2–1.0)
pH, UA: 6 (ref 5.0–7.5)

## 2019-07-13 LAB — MICROSCOPIC EXAMINATION: WBC, UA: >30 /hpf — AB (ref 0–5)

## 2019-07-13 LAB — BLADDER SCAN AMB NON-IMAGING: Scan Result: 315

## 2019-07-13 NOTE — Patient Instructions (Addendum)
Contact our office immediately if you develop the inability to urinate, lower abdominal pain, and/or lower abdominal distention in advance of your scheduled procedure later this week.  Cystoscopy Cystoscopy is a procedure that is used to help diagnose and sometimes treat conditions that affect the lower urinary tract. The lower urinary tract includes the bladder and the urethra. The urethra is the tube that drains urine from the bladder. Cystoscopy is done using a thin, tube-shaped instrument with a light and camera at the end (cystoscope). The cystoscope may be hard or flexible, depending on the goal of the procedure. The cystoscope is inserted through the urethra, into the bladder. Cystoscopy may be recommended if you have:  Urinary tract infections that keep coming back.  Blood in the urine (hematuria).  An inability to control when you urinate (urinary incontinence) or an overactive bladder.  Unusual cells found in a urine sample.  A blockage in the urethra, such as a urinary stone.  Painful urination.  An abnormality in the bladder found during an intravenous pyelogram (IVP) or CT scan. Cystoscopy may also be done to remove a sample of tissue to be examined under a microscope (biopsy). Tell a health care provider about:  Any allergies you have.  All medicines you are taking, including vitamins, herbs, eye drops, creams, and over-the-counter medicines.  Any problems you or family members have had with anesthetic medicines.  Any blood disorders you have.  Any surgeries you have had.  Any medical conditions you have.  Whether you are pregnant or may be pregnant. What are the risks? Generally, this is a safe procedure. However, problems may occur, including:  Infection.  Bleeding.  Allergic reactions to medicines.  Damage to other structures or organs. What happens before the procedure?  Ask your health care provider about: ? Changing or stopping your regular  medicines. This is especially important if you are taking diabetes medicines or blood thinners. ? Taking medicines such as aspirin and ibuprofen. These medicines can thin your blood. Do not take these medicines unless your health care provider tells you to take them. ? Taking over-the-counter medicines, vitamins, herbs, and supplements.  Follow instructions from your health care provider about eating or drinking restrictions.  Ask your health care provider what steps will be taken to help prevent infection. These may include: ? Washing skin with a germ-killing soap. ? Taking antibiotic medicine.  You may have an exam or testing, such as: ? X-rays of the bladder, urethra, or kidneys. ? Urine tests to check for signs of infection.  Plan to have someone take you home from the hospital or clinic. What happens during the procedure?   You will be given one or more of the following: ? A medicine to help you relax (sedative). ? A medicine to numb the area (local anesthetic).  The area around the opening of your urethra will be cleaned.  The cystoscope will be passed through your urethra into your bladder.  Germ-free (sterile) fluid will flow through the cystoscope to fill your bladder. The fluid will stretch your bladder so that your health care provider can clearly examine your bladder walls.  Your doctor will look at the urethra and bladder. Your doctor may take a biopsy or remove stones.  The cystoscope will be removed, and your bladder will be emptied. The procedure may vary among health care providers and hospitals. What can I expect after the procedure? After the procedure, it is common to have:  Some soreness or pain in  your abdomen and urethra.  Urinary symptoms. These include: ? Mild pain or burning when you urinate. Pain should stop within a few minutes after you urinate. This may last for up to 1 week. ? A small amount of blood in your urine for several days. ? Feeling like  you need to urinate but producing only a small amount of urine. Follow these instructions at home: Medicines  Take over-the-counter and prescription medicines only as told by your health care provider.  If you were prescribed an antibiotic medicine, take it as told by your health care provider. Do not stop taking the antibiotic even if you start to feel better. General instructions  Return to your normal activities as told by your health care provider. Ask your health care provider what activities are safe for you.  Do not drive for 24 hours if you were given a sedative during your procedure.  Watch for any blood in your urine. If the amount of blood in your urine increases, call your health care provider.  Follow instructions from your health care provider about eating or drinking restrictions.  If a tissue sample was removed for testing (biopsy) during your procedure, it is up to you to get your test results. Ask your health care provider, or the department that is doing the test, when your results will be ready.  Drink enough fluid to keep your urine pale yellow.  Keep all follow-up visits as told by your health care provider. This is important. Contact a health care provider if you:  Have pain that gets worse or does not get better with medicine, especially pain when you urinate.  Have trouble urinating.  Have more blood in your urine. Get help right away if you:  Have blood clots in your urine.  Have abdominal pain.  Have a fever or chills.  Are unable to urinate. Summary  Cystoscopy is a procedure that is used to help diagnose and sometimes treat conditions that affect the lower urinary tract.  Cystoscopy is done using a thin, tube-shaped instrument with a light and camera at the end.  After the procedure, it is common to have some soreness or pain in your abdomen and urethra.  Watch for any blood in your urine. If the amount of blood in your urine increases, call  your health care provider.  If you were prescribed an antibiotic medicine, take it as told by your health care provider. Do not stop taking the antibiotic even if you start to feel better. This information is not intended to replace advice given to you by your health care provider. Make sure you discuss any questions you have with your health care provider. Document Revised: 03/25/2018 Document Reviewed: 03/25/2018 Elsevier Patient Education  [place].

## 2019-07-13 NOTE — Progress Notes (Signed)
07/13/2019 1:14 PM   [name] [birth year] [id number]  CC: Urinary frequency, leakage, weak stream  HPI: Nicholas Grimes is a 86 y.o. male with PMH urethral stricture disease following PVP in 2011, elevated PVR (~21mL), traumatic catheterization with hematuria requiring CBI in 2019, and asymptomatic pyuria who presents today for evaluation of possible UTI. He is an established [facility] patient who last saw Dr. Bernardo Heater on 10/07/2018 for routine follow-up of the above.  He was recommended to follow-up in clinic in 6 months for repeat PVR and UA.  Today, he reports a gradual worsening of urinary urgency, frequency, leakage, intermittent weak stream over the past 2-3 months, and the sensation of incomplete bladder emptying. He denies a recent change in his symptoms and states he delayed care due to caring for his wife, who passed away last month.  In-office UA today positive for 1+ blood, trace protein, and 2+ leukocyte esterase; urine microscopy with >30 WBCs/HPF. PVR 311mL.  PMH: Past Medical History:  Diagnosis Date  . Arthritis   . Cancer ([place])    Basal Cell Skin Cancer  . Hypertension   . Hypothyroidism   . Thyroid disease     Surgical History: Past Surgical History:  Procedure Laterality Date  . CHOLECYSTECTOMY    . INGUINAL HERNIA REPAIR Right 10/30/2016   Procedure: HERNIA REPAIR INGUINAL ADULT;  Surgeon: Leonie Green, MD;  Location: [facility] OR[facility];  Service: General;  Laterality: Right;  . INSERTION OF MESH  10/30/2016   Procedure: INSERTION OF MESH;  Surgeon: Leonie Green, MD;  Location: [facility] OR[facility];  Service: General;;  . PROSTATE SURGERY      Home Medications:  Allergies as of 07/13/2019   No Known Allergies     Medication List       Accurate as of July 13, 2019  1:14 PM. If you have any questions, ask your nurse or doctor.        ALPRAZolam 0.25 MG tablet Commonly known as: XANAX Take by mouth.   doxazosin 8 MG tablet Commonly known as:  CARDURA   hydrochlorothiazide 25 MG tablet Commonly known as: HYDRODIURIL   levothyroxine 75 MCG tablet Commonly known as: SYNTHROID Take 75 mcg by mouth daily before breakfast.   multivitamin with minerals Tabs tablet Take 1 tablet by mouth daily.   simvastatin 20 MG tablet Commonly known as: ZOCOR Take 20 mg by mouth at bedtime.   tamsulosin 0.4 MG Caps capsule Commonly known as: FLOMAX Take 2 capsules (0.8 mg total) by mouth daily after supper.       Allergies:  No Known Allergies  Family History: Family History  Problem Relation Age of Onset  . Diabetes Mother   . CAD Father     Social History:   reports that he has quit smoking. His smoking use included cigarettes and cigars. He smoked 0.00 packs per day. He has quit using smokeless tobacco. He reports that he does not drink alcohol or use drugs.  Physical Exam: BP (!) 171/99   Pulse 96   Ht 6\' 1"  (1.854 m)   Wt 170 lb (77.1 kg)   BMI 22.43 kg/m   Constitutional:  Alert and oriented, no acute distress, nontoxic appearing HEENT: [place], AT Cardiovascular: No clubbing, cyanosis, or edema Respiratory: Normal respiratory effort, no increased work of breathing Skin: No rashes, bruises or suspicious lesions Neurologic: Grossly intact, no focal deficits, moving all 4 extremities Psychiatric: Normal mood and affect  Laboratory Data: Results for orders placed or  performed in visit on 07/13/19  Microscopic Examination   URINE  Result Value Ref Range   WBC, UA >30 (A) 0 - 5 /hpf   RBC 0-2 0 - 2 /hpf   Epithelial Cells (non renal) 0-10 0 - 10 /hpf   Bacteria, UA Few None seen/Few  Urinalysis, Complete  Result Value Ref Range   Specific Gravity, UA 1.015 1.005 - 1.030   pH, UA 6.0 5.0 - 7.5   Color, UA Yellow Yellow   Appearance Ur Cloudy (A) Clear   Leukocytes,UA 2+ (A) Negative   Protein,UA Trace (A) Negative/Trace   Glucose, UA Negative Negative   Ketones, UA Negative Negative   RBC, UA 1+ (A) Negative    Bilirubin, UA Negative Negative   Urobilinogen, Ur 0.2 0.2 - 1.0 mg/dL   Nitrite, UA Negative Negative   Microscopic Examination See below:   BLADDER SCAN AMB NON-IMAGING  Result Value Ref Range   Scan Result 315 ml    Assessment & Plan:   1. Urinary frequency 86 year old male with a history of urethral stricture disease following PVP in 2011, elevated PVR, traumatic catheterization with hematuria requiring CBI in 2019, and asymptomatic pyuria presents with a 90-month history of progressive worsening and urgency, frequency, leakage, intermittent weak stream, and a sensation of incomplete bladder emptying.  PVR elevated over baseline of 200 mL today.  However, given his denial of acute worsening of symptoms, I believe this is relatively stable.  Due to concern for recurrent hematuria, I will defer intervention for this today.  UA notable for pyuria, consistent with past samples.  Will send for culture, however I do not believe this represents an acute urinary tract infection. - Urinalysis, Complete - BLADDER SCAN AMB NON-IMAGING - CULTURE, URINE COMPREHENSIVE  2. Bulbous urethral stricture I suspect the patient's symptoms are consistent with worsening/recurrence of his urethral stricture disease.  Recommend cystoscopy with Dr. Bernardo Heater within 1 week given elevated PVR today.   Return in about 1 week (around 07/20/2019) for Cystoscopy with Dr. Bernardo Heater.  Debroah Loop, PA-C  [facility] [address] [place], [place] [address] [phone]

## 2019-07-15 ENCOUNTER — Other Ambulatory Visit: Payer: Self-pay | Admitting: Urology

## 2019-07-15 ENCOUNTER — Other Ambulatory Visit
Admission: RE | Admit: 2019-07-15 | Discharge: 2019-07-15 | Disposition: A | Discharge status: Home / Self Care | Payer: Medicare Other | Source: Ambulatory Visit | Attending: Urology | Admitting: Urology

## 2019-07-15 ENCOUNTER — Ambulatory Visit (INDEPENDENT_AMBULATORY_CARE_PROVIDER_SITE_OTHER): Payer: Medicare Other | Admitting: Urology

## 2019-07-15 ENCOUNTER — Other Ambulatory Visit: Payer: Self-pay

## 2019-07-15 ENCOUNTER — Encounter: Payer: Self-pay | Admitting: Urology

## 2019-07-15 VITALS — BP 150/76 | HR 79 | Ht 72.0 in | Wt 170.0 lb

## 2019-07-15 DIAGNOSIS — Z20822 Contact with and (suspected) exposure to covid-19: Secondary | ICD-10-CM | POA: Insufficient documentation

## 2019-07-15 DIAGNOSIS — Z01812 Encounter for preprocedural laboratory examination: Secondary | ICD-10-CM | POA: Insufficient documentation

## 2019-07-15 DIAGNOSIS — N35912 Unspecified bulbous urethral stricture, male: Secondary | ICD-10-CM

## 2019-07-15 DIAGNOSIS — R350 Frequency of micturition: Secondary | ICD-10-CM | POA: Diagnosis not present

## 2019-07-15 LAB — CULTURE, URINE COMPREHENSIVE

## 2019-07-15 NOTE — H&P (View-Only) (Signed)
   07/15/19  CC:  Chief Complaint  Patient presents with  . Cysto    HPI: 86 y.o. male who returns today for a cysto.   -slow stream  -frequency worsening onset last couple weeks  -denies gross hematuria   There were no vitals taken for this visit. NED. A&Ox3.   No respiratory distress, lungs clear CV: RRR Abd soft, NT, ND Normal phallus with bilateral descended testicles  Cystoscopy Procedure Note  Patient identification was confirmed, informed consent was obtained, and patient was prepped using Betadine solution.  Lidocaine jelly was administered per urethral meatus.    Pre-Procedure: - Inspection reveals a normal caliber urethral meatus.  Procedure: The flexible cystoscope was introduced without difficulty -Approximately 8 French bulbar urethral stricture   Post-Procedure: - Patient tolerated the procedure well  Assessment/ Plan:  1. Bulbous urethral stricture  Scheduled for cysto with urethral dilation tomorrow morning.  The procedure was discussed in detail clear potential risks of bleeding, infection.  The likelihood of recurrent stricture was also discussed.  He indicated all questions were answered and desires to proceed  2. Urinary frequency May be secondary to above    Abbie Sons, MD  I, Lucas Mallow, am acting as a scribe for Dr. Nicki Reaper C. Odelia Graciano,  I have reviewed the above documentation for accuracy and completeness, and I agree with the above.   Abbie Sons, MD

## 2019-07-15 NOTE — Progress Notes (Signed)
   07/15/19  CC:  Chief Complaint  Patient presents with  . Cysto    HPI: 86 y.o. male who returns today for a cysto.   -slow stream  -frequency worsening onset last couple weeks  -denies gross hematuria   There were no vitals taken for this visit. NED. A&Ox3.   No respiratory distress, lungs clear CV: RRR Abd soft, NT, ND Normal phallus with bilateral descended testicles  Cystoscopy Procedure Note  Patient identification was confirmed, informed consent was obtained, and patient was prepped using Betadine solution.  Lidocaine jelly was administered per urethral meatus.    Pre-Procedure: - Inspection reveals a normal caliber urethral meatus.  Procedure: The flexible cystoscope was introduced without difficulty -Approximately 8 French bulbar urethral stricture   Post-Procedure: - Patient tolerated the procedure well  Assessment/ Plan:  1. Bulbous urethral stricture  Scheduled for cysto with urethral dilation tomorrow morning.  The procedure was discussed in detail clear potential risks of bleeding, infection.  The likelihood of recurrent stricture was also discussed.  He indicated all questions were answered and desires to proceed  2. Urinary frequency May be secondary to above    Abbie Sons, MD  I, Lucas Mallow, am acting as a scribe for Dr. Nicki Reaper C. Riven Beebe,  I have reviewed the above documentation for accuracy and completeness, and I agree with the above.   Abbie Sons, MD

## 2019-07-16 ENCOUNTER — Encounter
Admission: RE | Disposition: A | Discharge status: Home / Self Care | Payer: Self-pay | Source: Home / Self Care | Attending: Urology

## 2019-07-16 ENCOUNTER — Encounter: Payer: Self-pay | Admitting: Urology

## 2019-07-16 ENCOUNTER — Ambulatory Visit
Admission: RE | Admit: 2019-07-16 | Discharge: 2019-07-16 | Disposition: A | Discharge status: Home / Self Care | Payer: Medicare Other | Attending: Urology | Admitting: Urology

## 2019-07-16 ENCOUNTER — Ambulatory Visit: Payer: Medicare Other | Admitting: Anesthesiology

## 2019-07-16 ENCOUNTER — Ambulatory Visit: Payer: Medicare Other

## 2019-07-16 DIAGNOSIS — R3911 Hesitancy of micturition: Secondary | ICD-10-CM | POA: Diagnosis not present

## 2019-07-16 DIAGNOSIS — N35912 Unspecified bulbous urethral stricture, male: Secondary | ICD-10-CM | POA: Insufficient documentation

## 2019-07-16 DIAGNOSIS — R339 Retention of urine, unspecified: Secondary | ICD-10-CM | POA: Diagnosis not present

## 2019-07-16 HISTORY — PX: CYSTOSCOPY WITH URETHRAL DILATATION: SHX5125

## 2019-07-16 LAB — URINALYSIS, COMPLETE
Bilirubin, UA: NEGATIVE
Glucose, UA: NEGATIVE
Ketones, UA: NEGATIVE
Nitrite, UA: NEGATIVE
Protein,UA: NEGATIVE
Specific Gravity, UA: 1.02 (ref 1.005–1.030)
Urobilinogen, Ur: 0.2 mg/dL (ref 0.2–1.0)
pH, UA: 6.5 (ref 5.0–7.5)

## 2019-07-16 LAB — SARS CORONAVIRUS 2 (TAT 6-24 HRS): SARS Coronavirus 2: NEGATIVE

## 2019-07-16 LAB — MICROSCOPIC EXAMINATION
Bacteria, UA: NONE SEEN
WBC, UA: >30 /hpf — AB (ref 0–5)

## 2019-07-16 SURGERY — CYSTOSCOPY, WITH URETHRAL DILATION
Anesthesia: General

## 2019-07-16 MED ORDER — LACTATED RINGERS IV SOLN: Freq: Once | INTRAVENOUS | Status: AC

## 2019-07-16 MED ORDER — FENTANYL CITRATE (PF) 100 MCG/2ML IJ SOLN
INTRAMUSCULAR | Status: DC | PRN
  Administered 2019-07-16: 50 ug via INTRAVENOUS

## 2019-07-16 MED ORDER — SUCCINYLCHOLINE CHLORIDE 20 MG/ML IJ SOLN
INTRAMUSCULAR | Status: DC | PRN
  Administered 2019-07-16: 100 mg via INTRAVENOUS

## 2019-07-16 MED ORDER — TRAMADOL HCL 50 MG PO TABS: 50.0000 mg | ORAL_TABLET | Freq: Four times a day (QID) | ORAL | 0 refills | Status: DC | PRN

## 2019-07-16 MED ORDER — SODIUM CHLORIDE 0.9 % IV SOLN
2.0000 g | Freq: Once | INTRAVENOUS | Status: AC
  Administered 2019-07-16: 2 g via INTRAVENOUS
  Filled 2019-07-16: qty 2000

## 2019-07-16 MED ORDER — SODIUM CHLORIDE 0.9 % IV SOLN
1.0000 g | INTRAVENOUS | Status: DC
  Filled 2019-07-16: qty 10

## 2019-07-16 MED ORDER — FENTANYL CITRATE (PF) 100 MCG/2ML IJ SOLN
25.0000 ug | INTRAMUSCULAR | Status: DC | PRN
  Administered 2019-07-16: 13:00:00 25 ug via INTRAVENOUS

## 2019-07-16 MED ORDER — OXYCODONE HCL 5 MG PO TABS
ORAL_TABLET | ORAL | Status: AC
  Administered 2019-07-16: 5 mg via ORAL
  Filled 2019-07-16: qty 1

## 2019-07-16 MED ORDER — IOHEXOL 180 MG/ML  SOLN
INTRAMUSCULAR | Status: DC | PRN
  Administered 2019-07-16: 12:00:00 20 mL

## 2019-07-16 MED ORDER — LIDOCAINE HCL (CARDIAC) PF 100 MG/5ML IV SOSY
PREFILLED_SYRINGE | INTRAVENOUS | Status: DC | PRN
  Administered 2019-07-16: 60 mg via INTRAVENOUS

## 2019-07-16 MED ORDER — MIDAZOLAM HCL 2 MG/2ML IJ SOLN
INTRAMUSCULAR | Status: AC
  Filled 2019-07-16: qty 2

## 2019-07-16 MED ORDER — LACTATED RINGERS IV SOLN: INTRAVENOUS | Status: DC | PRN

## 2019-07-16 MED ORDER — FENTANYL CITRATE (PF) 100 MCG/2ML IJ SOLN
INTRAMUSCULAR | Status: AC
  Filled 2019-07-16: qty 2

## 2019-07-16 MED ORDER — ONDANSETRON HCL 4 MG/2ML IJ SOLN: 4.0000 mg | Freq: Once | INTRAMUSCULAR | Status: DC | PRN

## 2019-07-16 MED ORDER — OXYCODONE HCL 5 MG PO TABS: 5.0000 mg | ORAL_TABLET | Freq: Once | ORAL | Status: AC

## 2019-07-16 MED ORDER — ONDANSETRON HCL 4 MG/2ML IJ SOLN
INTRAMUSCULAR | Status: DC | PRN
  Administered 2019-07-16: 4 mg via INTRAVENOUS

## 2019-07-16 MED ORDER — FAMOTIDINE 20 MG PO TABS: 20.0000 mg | ORAL_TABLET | Freq: Once | ORAL | Status: AC

## 2019-07-16 MED ORDER — PROPOFOL 10 MG/ML IV BOLUS
INTRAVENOUS | Status: DC | PRN
  Administered 2019-07-16: 100 mg via INTRAVENOUS

## 2019-07-16 MED ORDER — FAMOTIDINE 20 MG PO TABS
ORAL_TABLET | ORAL | Status: AC
  Administered 2019-07-16: 07:00:00 20 mg via ORAL
  Filled 2019-07-16: qty 1

## 2019-07-16 MED ORDER — AMOXICILLIN 875 MG PO TABS: 875.0000 mg | ORAL_TABLET | Freq: Two times a day (BID) | ORAL | 0 refills | Status: AC

## 2019-07-16 SURGICAL SUPPLY — 21 items
BAG URINE DRAIN 2000ML AR STRL (UROLOGICAL SUPPLIES) ×3 IMPLANT
BALLN URETL DIL 7X4 (MISCELLANEOUS) ×3
BALLOON URETL DIL 7X4 (MISCELLANEOUS) IMPLANT
CATH FOL 2WAY LX 16X5 (CATHETERS) ×3 IMPLANT
CATH FOLEY 2W COUNCIL 20FR 5CC (CATHETERS) ×2 IMPLANT
CATH SET URETHRAL DILATOR (CATHETERS) ×1 IMPLANT
CATH URETL 5X70 OPEN END (CATHETERS) ×2 IMPLANT
ELECT REM PT RETURN 9FT ADLT (ELECTROSURGICAL) ×3
ELECTRODE REM PT RTRN 9FT ADLT (ELECTROSURGICAL) ×1 IMPLANT
GLOVE BIO SURGEON STRL SZ8 (GLOVE) ×3 IMPLANT
GOWN STRL REUS W/ TWL XL LVL3 (GOWN DISPOSABLE) ×2 IMPLANT
GOWN STRL REUS W/TWL XL LVL3 (GOWN DISPOSABLE) ×4
GUIDEWIRE STR DUAL SENSOR (WIRE) ×3 IMPLANT
HOLDER FOLEY CATH W/STRAP (MISCELLANEOUS) ×3 IMPLANT
PACK CYSTO AR (MISCELLANEOUS) ×3 IMPLANT
SET CYSTO W/LG BORE CLAMP LF (SET/KITS/TRAYS/PACK) ×3 IMPLANT
SYR 30ML LL (SYRINGE) ×3 IMPLANT
SYR TOOMEY IRRIG 70ML (MISCELLANEOUS) ×3
SYRINGE TOOMEY IRRIG 70ML (MISCELLANEOUS) IMPLANT
WATER STERILE IRR 1000ML POUR (IV SOLUTION) ×3 IMPLANT
WATER STERILE IRR 3000ML UROMA (IV SOLUTION) ×3 IMPLANT

## 2019-07-16 NOTE — Discharge Instructions (Signed)
Indwelling Urinary Catheter Care, Adult An indwelling urinary catheter is a thin tube that is put into your bladder. The tube helps to drain pee (urine) out of your body. The tube goes in through your urethra. Your urethra is where pee comes out of your body. Your pee will come out through the catheter, then it will go into a bag (drainage bag). Take good care of your catheter so it will work well. How to wear your catheter and bag Supplies needed  Sticky tape (adhesive tape) or a leg strap.  Alcohol wipe or soap and water (if you use tape).  A clean towel (if you use tape).  Large overnight bag.  Smaller bag (leg bag). Wearing your catheter Attach your catheter to your leg with tape or a leg strap.  Make sure the catheter is not pulled tight.  If a leg strap gets wet, take it off and put on a dry strap.  If you use tape to hold the bag on your leg: 1. Use an alcohol wipe or soap and water to wash your skin where the tape made it sticky before. 2. Use a clean towel to pat-dry that skin. 3. Use new tape to make the bag stay on your leg. Wearing your bags You should have been given a large overnight bag.  You may wear the overnight bag in the day or night.  Always have the overnight bag lower than your bladder.  Do not let the bag touch the floor.  Before you go to sleep, put a clean plastic bag in a wastebasket. Then hang the overnight bag inside the wastebasket. You should also have a smaller leg bag that fits under your clothes.  Always wear the leg bag below your knee.  Do not wear your leg bag at night. How to care for your skin and catheter Supplies needed  A clean washcloth.  Water and mild soap.  A clean towel. Caring for your skin and catheter      Clean the skin around your catheter every day: 1. Wash your hands with soap and water. 2. Wet a clean washcloth in warm water and mild soap. 3. Clean the skin around your urethra.  If you are  male:  Gently spread the folds of skin around your vagina (labia).  With the washcloth in your other hand, wipe the inner side of your labia on each side. Wipe from front to back.  If you are male:  Pull back any skin that covers the end of your penis (foreskin).  With the washcloth in your other hand, wipe your penis in small circles. Start wiping at the tip of your penis, then move away from the catheter.  Move the foreskin back in place, if needed. 4. With your free hand, hold the catheter close to where it goes into your body.  Keep holding the catheter during cleaning so it does not get pulled out. 5. With the washcloth in your other hand, clean the catheter.  Only wipe downward on the catheter.  Do not wipe upward toward your body. Doing this may push germs into your urethra and cause infection. 6. Use a clean towel to pat-dry the catheter and the skin around it. Make sure to wipe off all soap. 7. Wash your hands with soap and water.  Shower every day. Do not take baths.  Do not use cream, ointment, or lotion on the area where the catheter goes into your body, unless your doctor tells you   to.  Do not use powders, sprays, or lotions on your genital area.  Check your skin around the catheter every day for signs of infection. Check for: ? Redness, swelling, or pain. ? Fluid or blood. ? Warmth. ? Pus or a bad smell. How to empty the bag Supplies needed  Rubbing alcohol.  Gauze pad or cotton ball.  Tape or a leg strap. Emptying the bag Pour the pee out of your bag when it is ?- full, or at least 2-3 times a day. Do this for your overnight bag and your leg bag. 1. Wash your hands with soap and water. 2. Separate (detach) the bag from your leg. 3. Hold the bag over the toilet or a clean pail. Keep the bag lower than your hips and bladder. This is so the pee (urine) does not go back into the tube. 4. Open the pour spout. It is at the bottom of the bag. 5. Empty the  pee into the toilet or pail. Do not let the pour spout touch any surface. 6. Put rubbing alcohol on a gauze pad or cotton ball. 7. Use the gauze pad or cotton ball to clean the pour spout. 8. Close the pour spout. 9. Attach the bag to your leg with tape or a leg strap. 10. Wash your hands with soap and water. Follow instructions for cleaning the drainage bag:  From the product maker.  As told by your doctor. How to change the bag Supplies needed  Alcohol wipes.  A clean bag.  Tape or a leg strap. Changing the bag Replace your bag when it starts to leak, smell bad, or look dirty. 1. Wash your hands with soap and water. 2. Separate the dirty bag from your leg. 3. Pinch the catheter with your fingers so that pee does not spill out. 4. Separate the catheter tube from the bag tube where these tubes connect (at the connection valve). Do not let the tubes touch any surface. 5. Clean the end of the catheter tube with an alcohol wipe. Use a different alcohol wipe to clean the end of the bag tube. 6. Connect the catheter tube to the tube of the clean bag. 7. Attach the clean bag to your leg with tape or a leg strap. Do not make the bag tight on your leg. 8. Wash your hands with soap and water. General rules   Never pull on your catheter. Never try to take it out. Doing that can hurt you.  Always wash your hands before and after you touch your catheter or bag. Use a mild, fragrance-free soap. If you do not have soap and water, use hand sanitizer.  Always make sure there are no twists or bends (kinks) in the catheter tube.  Always make sure there are no leaks in the catheter or bag.  Drink enough fluid to keep your pee pale yellow.  Do not take baths, swim, or use a hot tub.  If you are male, wipe from front to back after you poop (have a bowel movement). Contact a doctor if:  Your pee is cloudy.  Your pee smells worse than usual.  Your catheter gets clogged.  Your catheter  leaks.  Your bladder feels full. Get help right away if:  You have redness, swelling, or pain where the catheter goes into your body.  You have fluid, blood, pus, or a bad smell coming from the area where the catheter goes into your body.  Your skin feels warm where   the catheter goes into your body.  You have a fever.  You have pain in your: ? Belly (abdomen). ? Legs. ? Lower back. ? Bladder.  You see blood in the catheter.  Your pee is pink or red.  You feel sick to your stomach (nauseous).  You throw up (vomit).  You have chills.  Your pee is not draining into the bag.  Your catheter gets pulled out. Summary  An indwelling urinary catheter is a thin tube that is placed into the bladder to help drain pee (urine) out of the body.  The catheter is placed into the part of the body that drains pee from the bladder (urethra).  Taking good care of your catheter will keep it working properly and help prevent problems.  Always wash your hands before and after touching your catheter or bag.  Never pull on your catheter or try to take it out. This information is not intended to replace advice given to you by your health care provider. Make sure you discuss any questions you have with your health care provider. Document Revised: 07/25/2018 Document Reviewed: 11/16/2016 Elsevier Patient Education  2020 [place]   1) The drugs that you were given will stay in your system until tomorrow so for the next 24 hours you should not:  A) Drive an automobile B) Make any legal decisions C) Drink any alcoholic beverage   2) You may resume regular meals tomorrow.  Today it is better to start with liquids and gradually work up to solid foods.  You may eat anything you prefer, but it is better to start with liquids, then soup and crackers, and gradually work up to solid foods.   3) Please notify your doctor immediately if you have  any unusual bleeding, trouble breathing, redness and pain at the surgery site, drainage, fever, or pain not relieved by medication.    4) Additional Instructions:        Please contact your physician with any problems or Same Day Surgery at [phone], Monday through Friday 6 am to 4 pm, or [place] at [facility] number at [phone].AMBULATORY SURGERY  DISCHARGE INSTRUCTIONS   5) The drugs that you were given will stay in your system until tomorrow so for the next 24 hours you should not:  D) Drive an automobile E) Make any legal decisions F) Drink any alcoholic beverage   6) You may resume regular meals tomorrow.  Today it is better to start with liquids and gradually work up to solid foods.  You may eat anything you prefer, but it is better to start with liquids, then soup and crackers, and gradually work up to solid foods.   7) Please notify your doctor immediately if you have any unusual bleeding, trouble breathing, redness and pain at the surgery site, drainage, fever, or pain not relieved by medication.    8) Additional Instructions:  -Some blood in the urine will be normal -You will be contacted for an appointment for catheter removal in approximately 1 week -Antibiotic Rx was sent to your pharmacy -Call [place] [phone] fever greater than 101 degrees, chills or catheter drainage problems    Please contact your physician with any problems or Same Day Surgery at [phone], Monday through Friday 6 am to 4 pm, or [place] at [facility] number at [phone].

## 2019-07-16 NOTE — Anesthesia Postprocedure Evaluation (Signed)
Anesthesia Post Note  Patient: Nicholas Grimes  Procedure(s) Performed: CYSTOSCOPY WITH URETHRAL DILATATION (N/A )  Patient location during evaluation: Endoscopy Anesthesia Type: General Level of consciousness: awake and alert Pain management: pain level controlled Vital Signs Assessment: post-procedure vital signs reviewed and stable Respiratory status: spontaneous breathing, nonlabored ventilation and respiratory function stable Cardiovascular status: blood pressure returned to baseline and stable Postop Assessment: no apparent nausea or vomiting Anesthetic complications: no     Last Vitals:  Vitals:   07/16/19 1321 07/16/19 1341  BP: (!) 179/80 (!) 176/98  Pulse: (!) 47 (!) 56  Resp: 14 20  Temp:  (!) 36.3 C  SpO2: 98% 100%    Last Pain:  Vitals:   07/16/19 1341  TempSrc: Temporal  PainSc: 2                  Alphonsus Sias

## 2019-07-16 NOTE — Transfer of Care (Signed)
Immediate Anesthesia Transfer of Care Note  Patient: Nicholas Grimes  Procedure(s) Performed: CYSTOSCOPY WITH URETHRAL DILATATION (N/A )  Patient Location: PACU  Anesthesia Type:General  Level of Consciousness: drowsy, patient cooperative and responds to stimulation  Airway & Oxygen Therapy: Patient Spontanous Breathing and Patient connected to face mask oxygen  Post-op Assessment: Report given to RN and Post -op Vital signs reviewed and stable  Post vital signs: Reviewed and stable  Last Vitals:  Vitals Value Taken Time  BP 155/82 07/16/19 1206  Temp 36.3 C 07/16/19 1206  Pulse 65 07/16/19 1208  Resp 11 07/16/19 1208  SpO2 100 % 07/16/19 1208  Vitals shown include unvalidated device data.  Last Pain:  Vitals:   07/16/19 1206  TempSrc:   PainSc: Asleep         Complications: No apparent anesthesia complications

## 2019-07-16 NOTE — Op Note (Signed)
Preoperative diagnosis:  1. Urethral stricture 2. Incomplete bladder emptying  Postoperative diagnosis:  1. Same  Procedure: 1. Cystoscopy with balloon dilation of urethral stricture  Surgeon: Abbie Sons, MD  Anesthesia: General  Complications: None  Intraoperative findings:  ~ 8 FR bulbar urethral stricture  EBL: Minimal  Specimens: None  Indication: Nicholas Grimes is a 86 y.o. with a history of recurrent urethral stricture disease. Over the last several weeks he complains of urinary hesitancy, decreased stream and sensation of incomplete emptying. PVR in office was 315 mL. Office cystoscopy remarkable for a recurrent bulbar stricture.  After reviewing the management options for treatment, he elected to proceed with the above surgical procedure(s). We have discussed the potential benefits and risks of the procedure, side effects of the proposed treatment, the likelihood of the patient achieving the goals of the procedure, and any potential problems that might occur during the procedure or recuperation. Informed consent has been obtained.  Description of procedure:  The patient was taken to the operating room and general anesthesia was induced.  The patient was placed in the dorsal lithotomy position, prepped and draped in the usual sterile fashion, and preoperative antibiotics were administered. A preoperative time-out was performed.   A 21 French cystoscope was lubricated and passed per urethra. The stricture was identified in the bulbar urethra as above. A 0.038 Sensor wire was placed through the stricture and advanced into the bladder without difficulty. Wire position was verified with fluoroscopy. A 21 FR/4 cm UroMax balloon was placed over the wire and advanced to the stricture. The balloon was then inflated to 15 atm. Under fluoroscopy there was full balloon inflation without narrowing. After several minutes the balloon was deflated and removed. The cystoscope was then  easily advanced through the dilated stricture. There was mild prostate adenoma in the distal prostatic urethra and otherwise the channel was open. The bladder was trabeculated with scattered cellules and diverticula. No papillary or solid lesions were noted. The guidewire was replaced to the cystoscope and the cystoscope was removed. A 20 French Councill catheter was placed over the wire without difficulty and inflated with 10 mL of sterile water. Mild hematuria was noted. The catheter is irrigated with return of rose' effluent.  After anesthetic reversal he was transported to the PACU in stable condition  Plan: Catheter will remain indwelling x7 days and he will follow up in office for removal.   Abbie Sons, M.D.

## 2019-07-16 NOTE — Interval H&P Note (Signed)
History and Physical Interval Note:  07/16/2019 10:36 AM  Nicholas Grimes  has presented today for surgery, with the diagnosis of Urethral Stricture.  The various methods of treatment have been discussed with the patient and family. After consideration of risks, benefits and other options for treatment, the patient has consented to  Procedure(s): CYSTOSCOPY WITH URETHRAL DILATATION (N/A) as a surgical intervention.  The patient's history has been reviewed, patient examined, no change in status, stable for surgery.  I have reviewed the patient's chart and labs.  Questions were answered to the patient's satisfaction.     [place]

## 2019-07-16 NOTE — Anesthesia Preprocedure Evaluation (Signed)
Anesthesia Evaluation  Patient identified by MRN, date of birth, ID band Patient awake    Reviewed: Allergy & Precautions, H&P , NPO status , Patient's Chart, lab work & pertinent test results, reviewed documented beta blocker date and time   Airway Mallampati: II  TM Distance: >3 FB Neck ROM: full    Dental  (+) Teeth Intact   Pulmonary neg pulmonary ROS, former smoker,    Pulmonary exam normal        Cardiovascular Exercise Tolerance: Poor hypertension, On Medications negative cardio ROS Normal cardiovascular exam Rhythm:regular Rate:Normal     Neuro/Psych negative neurological ROS  negative psych ROS   GI/Hepatic negative GI ROS, Neg liver ROS,   Endo/Other  Hypothyroidism   Renal/GU Renal disease  negative genitourinary   Musculoskeletal   Abdominal   Peds  Hematology negative hematology ROS (+)   Anesthesia Other Findings Past Medical History: No date: Arthritis No date: Cancer ([place])     Comment:  Basal Cell Skin Cancer No date: Hypertension No date: Hypothyroidism No date: Thyroid disease Past Surgical History: No date: CHOLECYSTECTOMY 10/30/2016: INGUINAL HERNIA REPAIR; Right     Comment:  Procedure: HERNIA REPAIR INGUINAL ADULT;  Surgeon:               Leonie Green, MD;  Location: [facility] ORS;  Service:               General;  Laterality: Right; 10/30/2016: INSERTION OF MESH     Comment:  Procedure: INSERTION OF MESH;  Surgeon: Leonie Green, MD;  Location: [facility] ORS;  Service: General;; No date: PROSTATE SURGERY   Reproductive/Obstetrics negative OB ROS                             Anesthesia Physical Anesthesia Plan  ASA: III  Anesthesia Plan: General ETT   Post-op Pain Management:    Induction:   PONV Risk Score and Plan:   Airway Management Planned:   Additional Equipment:   Intra-op Plan:   Post-operative Plan:   Informed  Consent: I have reviewed the patients History and Physical, chart, labs and discussed the procedure including the risks, benefits and alternatives for the proposed anesthesia with the patient or authorized representative who has indicated his/her understanding and acceptance.     Dental Advisory Given  Plan Discussed with: CRNA  Anesthesia Plan Comments:         Anesthesia Quick Evaluation

## 2019-07-16 NOTE — Anesthesia Procedure Notes (Signed)
Procedure Name: Intubation Date/Time: 07/16/2019 11:31 AM Performed by: Kelton Pillar, CRNA Pre-anesthesia Checklist: Patient identified, Emergency Drugs available, Suction available and Patient being monitored Patient Re-evaluated:Patient Re-evaluated prior to induction Oxygen Delivery Method: Circle system utilized Preoxygenation: Pre-oxygenation with 100% oxygen Induction Type: IV induction Ventilation: Mask ventilation without difficulty Laryngoscope Size: McGraph and 3 Tube type: Oral Tube size: 7.0 mm Number of attempts: 1 Airway Equipment and Method: Stylet and Oral airway Placement Confirmation: ETT inserted through vocal cords under direct vision,  positive ETCO2 and breath sounds checked- equal and bilateral Secured at: 22 cm Tube secured with: Tape Dental Injury: Teeth and Oropharynx as per pre-operative assessment

## 2019-07-23 ENCOUNTER — Ambulatory Visit: Payer: Self-pay

## 2019-07-23 ENCOUNTER — Other Ambulatory Visit: Payer: Self-pay

## 2019-07-23 ENCOUNTER — Ambulatory Visit (INDEPENDENT_AMBULATORY_CARE_PROVIDER_SITE_OTHER): Payer: Medicare Other | Admitting: Physician Assistant

## 2019-07-23 ENCOUNTER — Telehealth: Payer: Self-pay | Admitting: Urology

## 2019-07-23 ENCOUNTER — Telehealth: Payer: Self-pay | Admitting: *Deleted

## 2019-07-23 DIAGNOSIS — N35912 Unspecified bulbous urethral stricture, male: Secondary | ICD-10-CM

## 2019-07-23 NOTE — Patient Instructions (Signed)

## 2019-07-23 NOTE — Progress Notes (Signed)
Catheter Removal  Patient is present today for a catheter removal.  10 ml of water was drained from the balloon. A 20 FR foley cath was removed from the bladder no complications were noted . Patient tolerated well.  Performed by: Kerman Passey, RMA  Follow up/ Additional notes: Advised patient to continue self cath once a day. Follow up in 4-6 weeks with DR. Stoiff.  If patient has any issues prior, will call before

## 2019-07-23 NOTE — Telephone Encounter (Signed)
Patient called Triage line-unable to urinate since catheter removal since this morning. Patient advised he needs to self-cath once a day. Denies pain. Patient wanted to wait to use supplies, he is aware he needs to self-cath. Order sheet for supplies faxed this morning. Patient voiced understanding.

## 2019-07-23 NOTE — Telephone Encounter (Signed)
Prior to surgery pt was asked to stop taking his BP meds, pt is now asking when should he resume taking his BP meds. Please advise pt at [phone]

## 2019-07-23 NOTE — Telephone Encounter (Signed)
Spoke with patient, ok to resume medications. Voiced understanding.

## 2019-07-29 NOTE — Progress Notes (Signed)
Continuous Intermittent Catheterization  Patient returned this afternoon stating that he was unable to pass his straight catheter for CIC. He was brought in for a teaching of self I & O Catheterization with a flex coude. Patient was given detailed verbal and printed instructions of self catheterization. Patient was cleaned and prepped in a sterile fashion.  With instruction and assistance patient inserted a 16 coude tip FR and urine return was noted 130 ml, urine was dark yellow in color. Patient tolerated well, no complications were noted Patient was given a sample bag with supplies to take home.  Instructions were given per Dr. Bernardo Heater for patient to cath at least once daily.  An order was placed with [employer] for catheters to be sent to the patient's home. Patient is to keep follow up as scheduled  Preformed by: Fonnie Jarvis, CMA

## 2019-09-02 NOTE — Progress Notes (Signed)
09/03/19 1:06 PM   Nicholas Grimes 04/17/1930 [id number]  Referring provider: Baxter Hire, MD [place],  [place] [address] Chief Complaint  Patient presents with  . Urinary Frequency    HPI: Nicholas Grimes is a 86 y.o. M who returns today for the evaluation and management urethral stricture disease.   -long history of urethral stricture disease after PVP in 2011 -traumatic catheterization in August 2019 with hematuria -Approx 8 French bulbar urethral stricture noted on cysto  -urethral dilatation on 07/16/19  -Recommended to resume CIC after cath removed -stopped catheter use secondary to gross hematuria  -last catheterized ~ 2 weeks ago  -Only symptom urinary frequency which he states is not bothersome -PVR 510 mL, post voiding 434 mL    PMH: Past Medical History:  Diagnosis Date  . Arthritis   . Cancer ([place])    Basal Cell Skin Cancer  . Hypertension   . Hypothyroidism   . Thyroid disease     Surgical History: Past Surgical History:  Procedure Laterality Date  . CHOLECYSTECTOMY    . CYSTOSCOPY WITH URETHRAL DILATATION N/A 07/16/2019   Procedure: CYSTOSCOPY WITH URETHRAL DILATATION;  Surgeon: Abbie Sons, MD;  Location: [facility];  Service: Urology;  Laterality: N/A;  . INGUINAL HERNIA REPAIR Right 10/30/2016   Procedure: HERNIA REPAIR INGUINAL ADULT;  Surgeon: Leonie Green, MD;  Location: [facility];  Service: General;  Laterality: Right;  . INSERTION OF MESH  10/30/2016   Procedure: INSERTION OF MESH;  Surgeon: Leonie Green, MD;  Location: [facility];  Service: General;;  . PROSTATE SURGERY      Home Medications:  Allergies as of 09/03/2019   No Known Allergies     Medication List       Accurate as of Sep 03, 2019  1:06 PM. If you have any questions, ask your nurse or doctor.        hydrochlorothiazide 25 MG tablet Commonly known as: HYDRODIURIL Take 25 mg by mouth daily.   levothyroxine 75 MCG tablet Commonly  known as: SYNTHROID Take 75 mcg by mouth daily before breakfast.   multivitamin with minerals Tabs tablet Take 1 tablet by mouth daily at 12 noon. Cemtrum Silver   simvastatin 20 MG tablet Commonly known as: ZOCOR Take 20 mg by mouth at bedtime.   tamsulosin 0.4 MG Caps capsule Commonly known as: FLOMAX Take 2 capsules (0.8 mg total) by mouth daily after supper.   traMADol 50 MG tablet Commonly known as: ULTRAM Take 1 tablet (50 mg total) by mouth every 6 (six) hours as needed for moderate pain.       Allergies: No Known Allergies  Family History: Family History  Problem Relation Age of Onset  . Diabetes Mother   . CAD Father     Social History:  reports that he has quit smoking. His smoking use included cigarettes and cigars. He smoked 0.00 packs per day. He has quit using smokeless tobacco. He reports that he does not drink alcohol or use drugs.   Physical Exam: BP (!) 155/77   Pulse 93   Ht 6' (1.829 m)   Wt 175 lb (79.4 kg)   BMI 23.73 kg/m   Constitutional:  Alert and oriented, No acute distress. HEENT:  AT, moist mucus membranes.  Trachea midline, no masses. Cardiovascular: No clubbing, cyanosis, or edema. Respiratory: Normal respiratory effort, no increased work of breathing. Skin: No rashes, bruises or suspicious lesions. Neurologic: Grossly intact, no focal  deficits, moving all 4 extremities. Psychiatric: Normal mood and affect.  Assessment & Plan:    1. Bulbous Urethral Stricture  Urethral dilatation on 07/16/19  Last catheterized a couple of weeks ago Elevated residual today; I recommended Foley catheter placement however he refused stating he was not having any problems Explained likelihood of stricture reformation if not compliant w/ catheter use   Agreed to schedule cystoscopy for evaluation but not today  2.  Urinary Frequency  Symptomatic See above  4. Elevated residual As above   [facility] [address] [place], [place] [address] [phone]  I, Lucas Mallow, am acting as a scribe for Dr. Nicki Reaper C. Ry Moody,  I have reviewed the above documentation for accuracy and completeness, and I agree with the above.    Abbie Sons, MD

## 2019-09-03 ENCOUNTER — Ambulatory Visit: Payer: Medicare Other | Admitting: Urology

## 2019-09-03 ENCOUNTER — Encounter: Payer: Self-pay | Admitting: Urology

## 2019-09-03 ENCOUNTER — Other Ambulatory Visit: Payer: Self-pay

## 2019-09-03 ENCOUNTER — Ambulatory Visit: Payer: Self-pay | Admitting: Urology

## 2019-09-03 VITALS — BP 155/77 | HR 93 | Ht 72.0 in | Wt 175.0 lb

## 2019-09-03 DIAGNOSIS — R350 Frequency of micturition: Secondary | ICD-10-CM

## 2019-09-03 LAB — BLADDER SCAN AMB NON-IMAGING: Scan Result: 434

## 2019-09-08 ENCOUNTER — Other Ambulatory Visit: Payer: Self-pay

## 2019-09-08 ENCOUNTER — Ambulatory Visit: Payer: Medicare Other | Admitting: Physician Assistant

## 2019-09-08 ENCOUNTER — Encounter: Payer: Self-pay | Admitting: Physician Assistant

## 2019-09-08 ENCOUNTER — Telehealth: Payer: Self-pay | Admitting: Physician Assistant

## 2019-09-08 VITALS — BP 162/83 | HR 98 | Ht 72.0 in | Wt 175.0 lb

## 2019-09-08 DIAGNOSIS — R350 Frequency of micturition: Secondary | ICD-10-CM | POA: Diagnosis not present

## 2019-09-08 DIAGNOSIS — N35912 Unspecified bulbous urethral stricture, male: Secondary | ICD-10-CM | POA: Diagnosis not present

## 2019-09-08 LAB — BLADDER SCAN AMB NON-IMAGING: Scan Result: 254

## 2019-09-08 MED ORDER — NITROFURANTOIN MONOHYD MACRO 100 MG PO CAPS: 100.0000 mg | ORAL_CAPSULE | Freq: Two times a day (BID) | ORAL | 0 refills | Status: AC

## 2019-09-08 NOTE — Telephone Encounter (Signed)
Pt called office stating that he has had severe urgency with burning w/urination since yesterday, also states he saw small specs of blood yesterday.  Pt states he has an upcoming appt next week but can't wait that long, says someone has to "help" him , he can't go on like this" offered pt appt with Sam, pt accepted.

## 2019-09-08 NOTE — Progress Notes (Signed)
Simple Catheter Placement  Due to urinary retention patient is present today for a foley cath placement. Patient was cleaned and prepped in a sterile fashion with betadine. A 16FR foley catheter was inserted, urine return was noted  24ml, urine was dark yellow in color. The balloon was filled with 10cc of sterile water.  A leg  bag was attached for drainage. Patient declined to take night bag home as he has one from a previous visit. Patient given an additional leg bag per his request. Patient was given instruction on proper catheter care.  Patient tolerated well, no complications were noted.   Performed by: Gordy Clement, CMA   Additional notes/ Follow up: RTC in 1 month.

## 2019-09-08 NOTE — Progress Notes (Signed)
09/08/2019 11:53 AM   Nicholas Grimes April 22, 1930 [id number]  CC: Chief Complaint  Patient presents with  . Urinary Frequency    HPI: Nicholas Grimes is a 86 y.o. male with PMH urethral stricture disease s/p urethral dilation on 07/16/2019 who presents today for evaluation of urgency, dysuria, and gross hematuria.  He was seen in clinic most recently 5 days ago for postop follow-up after his recent urethral dilation.  At that visit, he explained that he had stopped CIC at home secondary to gross hematuria.  He was found to have a significantly elevated PVR of 459mL but refused Foley catheterization at that time.  Today, patient reports a 1 day history of urinary frequency and dysuria.  He states he is going to the restroom every few minutes.  He requests catheterization today.  In-office UA today positive for 2+ blood, 2+ protein, and 3+ leukocyte esterase; urine microscopy with 6-10 WBCs/HPF, 3-10 RBCs/HPF, and moderate bacteria. PVR 231mL.  PMH: Past Medical History:  Diagnosis Date  . Arthritis   . Cancer ([place])    Basal Cell Skin Cancer  . Hypertension   . Hypothyroidism   . Thyroid disease     Surgical History: Past Surgical History:  Procedure Laterality Date  . CHOLECYSTECTOMY    . CYSTOSCOPY WITH URETHRAL DILATATION N/A 07/16/2019   Procedure: CYSTOSCOPY WITH URETHRAL DILATATION;  Surgeon: Abbie Sons, MD;  Location: [facility];  Service: Urology;  Laterality: N/A;  . INGUINAL HERNIA REPAIR Right 10/30/2016   Procedure: HERNIA REPAIR INGUINAL ADULT;  Surgeon: Leonie Green, MD;  Location: [facility];  Service: General;  Laterality: Right;  . INSERTION OF MESH  10/30/2016   Procedure: INSERTION OF MESH;  Surgeon: Leonie Green, MD;  Location: [facility];  Service: General;;  . PROSTATE SURGERY      Home Medications:  Allergies as of 09/08/2019   No Known Allergies     Medication List       Accurate as of Sep 08, 2019 11:53 AM. If you have any  questions, ask your nurse or doctor.        hydrochlorothiazide 25 MG tablet Commonly known as: HYDRODIURIL Take 25 mg by mouth daily.   levothyroxine 75 MCG tablet Commonly known as: SYNTHROID Take 75 mcg by mouth daily before breakfast.   multivitamin with minerals Tabs tablet Take 1 tablet by mouth daily at 12 noon. Cemtrum Silver   simvastatin 20 MG tablet Commonly known as: ZOCOR Take 20 mg by mouth at bedtime.   tamsulosin 0.4 MG Caps capsule Commonly known as: FLOMAX Take 2 capsules (0.8 mg total) by mouth daily after supper.   traMADol 50 MG tablet Commonly known as: ULTRAM Take 1 tablet (50 mg total) by mouth every 6 (six) hours as needed for moderate pain.       Allergies:  No Known Allergies  Family History: Family History  Problem Relation Age of Onset  . Diabetes Mother   . CAD Father     Social History:   reports that he has quit smoking. His smoking use included cigarettes and cigars. He smoked 0.00 packs per day. He has quit using smokeless tobacco. He reports that he does not drink alcohol or use drugs.  Physical Exam: BP (!) 162/83   Pulse 98   Ht 6' (1.829 m)   Wt 175 lb (79.4 kg)   BMI 23.73 kg/m   Constitutional:  Alert and oriented, no acute distress, nontoxic appearing HEENT: [place], AT  Cardiovascular: No clubbing, cyanosis, or edema Respiratory: Normal respiratory effort, no increased work of breathing Skin: No rashes, bruises or suspicious lesions Neurologic: Grossly intact, no focal deficits, moving all 4 extremities Psychiatric: Normal mood and affect  Laboratory Data: Results for orders placed or performed in visit on 09/08/19  Bladder Scan (Post Void Residual) in office  Result Value Ref Range   Scan Result 254    Assessment & Plan:   1. Urinary frequency UA today consistent with infection, will send for culture for further evaluation.  Starting patient on empiric Macrobid. - Bladder Scan (Post Void Residual) in office -  Urinalysis, Complete - CULTURE, URINE COMPREHENSIVE - nitrofurantoin, macrocrystal-monohydrate, (MACROBID) 100 MG capsule; Take 1 capsule (100 mg total) by mouth every 12 (twelve) hours for 10 days.  Dispense: 20 capsule; Refill: 0  2. Bulbous urethral stricture Agree with catheterization today for management of incomplete emptying and for prevention of urethral stricture recurrence.  Patient is unsure if he wishes to retain catheter chronically.  I recommend SP tube placement today, patient prefers to think about this.  Foley catheter placed today, see separate procedure note for details.  Plan for routine catheter change in 1 month and will revisit discussion of possible SP tube at that time.  Return in about 1 month (around 10/09/2019) for Catheter exchange with possible SPT counseling.  Debroah Loop, PA-C  [facility] [address], [place], [place] [address] [phone]

## 2019-09-09 ENCOUNTER — Telehealth: Payer: Self-pay | Admitting: Family Medicine

## 2019-09-09 LAB — URINALYSIS, COMPLETE
Bilirubin, UA: NEGATIVE
Glucose, UA: NEGATIVE
Ketones, UA: NEGATIVE
Nitrite, UA: NEGATIVE
Specific Gravity, UA: 1.01 (ref 1.005–1.030)
Urobilinogen, Ur: 0.2 mg/dL (ref 0.2–1.0)
pH, UA: >9 — ABNORMAL HIGH (ref 5.0–7.5)

## 2019-09-09 LAB — MICROSCOPIC EXAMINATION

## 2019-09-09 MED ORDER — OXYBUTYNIN CHLORIDE 5 MG PO TABS: 5.0000 mg | ORAL_TABLET | ORAL | 0 refills | Status: DC | PRN

## 2019-09-09 NOTE — Telephone Encounter (Signed)
Patient called stating he is having bladder spasms. He states the urine is coming out around the catheter. I expressed this is normal as the bladder muscles are trying to heal. Oxybutynin was sent to pharmacy for patient to take only as needed for bladder spasms. Patient voiced understanding.

## 2019-09-12 ENCOUNTER — Inpatient Hospital Stay
Admission: EM | Admit: 2019-09-12 | Discharge: 2019-09-14 | DRG: 690 | Disposition: A | Discharge status: Home Health | Payer: Medicare Other | Attending: Internal Medicine | Admitting: Internal Medicine

## 2019-09-12 ENCOUNTER — Other Ambulatory Visit: Payer: Self-pay

## 2019-09-12 DIAGNOSIS — R310 Gross hematuria: Secondary | ICD-10-CM

## 2019-09-12 DIAGNOSIS — I951 Orthostatic hypotension: Secondary | ICD-10-CM | POA: Diagnosis not present

## 2019-09-12 DIAGNOSIS — D62 Acute posthemorrhagic anemia: Secondary | ICD-10-CM | POA: Diagnosis present

## 2019-09-12 DIAGNOSIS — E861 Hypovolemia: Secondary | ICD-10-CM | POA: Diagnosis present

## 2019-09-12 DIAGNOSIS — Z8249 Family history of ischemic heart disease and other diseases of the circulatory system: Secondary | ICD-10-CM

## 2019-09-12 DIAGNOSIS — I493 Ventricular premature depolarization: Secondary | ICD-10-CM | POA: Diagnosis present

## 2019-09-12 DIAGNOSIS — R319 Hematuria, unspecified: Secondary | ICD-10-CM | POA: Diagnosis present

## 2019-09-12 DIAGNOSIS — E785 Hyperlipidemia, unspecified: Secondary | ICD-10-CM | POA: Diagnosis present

## 2019-09-12 DIAGNOSIS — N3281 Overactive bladder: Secondary | ICD-10-CM | POA: Diagnosis present

## 2019-09-12 DIAGNOSIS — R338 Other retention of urine: Secondary | ICD-10-CM | POA: Diagnosis not present

## 2019-09-12 DIAGNOSIS — Z79890 Hormone replacement therapy (postmenopausal): Secondary | ICD-10-CM

## 2019-09-12 DIAGNOSIS — Z79899 Other long term (current) drug therapy: Secondary | ICD-10-CM

## 2019-09-12 DIAGNOSIS — N401 Enlarged prostate with lower urinary tract symptoms: Secondary | ICD-10-CM | POA: Diagnosis present

## 2019-09-12 DIAGNOSIS — Z20822 Contact with and (suspected) exposure to covid-19: Secondary | ICD-10-CM | POA: Diagnosis present

## 2019-09-12 DIAGNOSIS — Z85828 Personal history of other malignant neoplasm of skin: Secondary | ICD-10-CM

## 2019-09-12 DIAGNOSIS — N3289 Other specified disorders of bladder: Secondary | ICD-10-CM | POA: Diagnosis not present

## 2019-09-12 DIAGNOSIS — M1990 Unspecified osteoarthritis, unspecified site: Secondary | ICD-10-CM | POA: Diagnosis present

## 2019-09-12 DIAGNOSIS — I959 Hypotension, unspecified: Secondary | ICD-10-CM | POA: Diagnosis not present

## 2019-09-12 DIAGNOSIS — I10 Essential (primary) hypertension: Secondary | ICD-10-CM | POA: Diagnosis present

## 2019-09-12 DIAGNOSIS — E876 Hypokalemia: Secondary | ICD-10-CM

## 2019-09-12 DIAGNOSIS — N3091 Cystitis, unspecified with hematuria: Secondary | ICD-10-CM | POA: Diagnosis not present

## 2019-09-12 DIAGNOSIS — I491 Atrial premature depolarization: Secondary | ICD-10-CM | POA: Diagnosis present

## 2019-09-12 DIAGNOSIS — Z9079 Acquired absence of other genital organ(s): Secondary | ICD-10-CM

## 2019-09-12 DIAGNOSIS — E871 Hypo-osmolality and hyponatremia: Secondary | ICD-10-CM | POA: Diagnosis present

## 2019-09-12 DIAGNOSIS — R5381 Other malaise: Secondary | ICD-10-CM

## 2019-09-12 DIAGNOSIS — Z87891 Personal history of nicotine dependence: Secondary | ICD-10-CM

## 2019-09-12 DIAGNOSIS — E039 Hypothyroidism, unspecified: Secondary | ICD-10-CM | POA: Diagnosis present

## 2019-09-12 DIAGNOSIS — N368 Other specified disorders of urethra: Secondary | ICD-10-CM | POA: Diagnosis present

## 2019-09-12 DIAGNOSIS — E878 Other disorders of electrolyte and fluid balance, not elsewhere classified: Secondary | ICD-10-CM | POA: Diagnosis present

## 2019-09-12 LAB — CBC
HCT: 36.7 % — ABNORMAL LOW (ref 39.0–52.0)
HCT: 32.2 % — ABNORMAL LOW (ref 39.0–52.0)
Hemoglobin: 12.6 g/dL — ABNORMAL LOW (ref 13.0–17.0)
Hemoglobin: 11 g/dL — ABNORMAL LOW (ref 13.0–17.0)
MCH: 30.1 pg (ref 26.0–34.0)
MCH: 30.1 pg (ref 26.0–34.0)
MCHC: 34.3 g/dL (ref 30.0–36.0)
MCHC: 34.2 g/dL (ref 30.0–36.0)
MCV: 87.8 fL (ref 80.0–100.0)
MCV: 88 fL (ref 80.0–100.0)
Platelets: 189 10*3/uL (ref 150–400)
Platelets: 193 10*3/uL (ref 150–400)
RBC: 4.18 MIL/uL — ABNORMAL LOW (ref 4.22–5.81)
RBC: 3.66 MIL/uL — ABNORMAL LOW (ref 4.22–5.81)
RDW: 13.4 % (ref 11.5–15.5)
RDW: 13.3 % (ref 11.5–15.5)
WBC: 3.7 10*3/uL — ABNORMAL LOW (ref 4.0–10.5)
WBC: 6 10*3/uL (ref 4.0–10.5)
nRBC: 0 % (ref 0.0–0.2)
nRBC: 0 % (ref 0.0–0.2)

## 2019-09-12 LAB — BASIC METABOLIC PANEL
Anion gap: 11 (ref 5–15)
BUN: 13 mg/dL (ref 8–23)
CO2: 26 mmol/L (ref 22–32)
Calcium: 9 mg/dL (ref 8.9–10.3)
Chloride: 91 mmol/L — ABNORMAL LOW (ref 98–111)
Creatinine, Ser: 1.03 mg/dL (ref 0.61–1.24)
GFR calc Af Amer: >60 mL/min (ref >60)
GFR calc non Af Amer: >60 mL/min (ref >60)
Glucose, Bld: 161 mg/dL — ABNORMAL HIGH (ref 70–99)
Potassium: 3 mmol/L — ABNORMAL LOW (ref 3.5–5.1)
Sodium: 128 mmol/L — ABNORMAL LOW (ref 135–145)

## 2019-09-12 LAB — SARS CORONAVIRUS 2 BY RT PCR (HOSPITAL ORDER, PERFORMED IN ~~LOC~~ HOSPITAL LAB): SARS Coronavirus 2: NEGATIVE

## 2019-09-12 LAB — TYPE AND SCREEN
ABO/RH(D): A POS
Antibody Screen: NEGATIVE

## 2019-09-12 LAB — TSH: TSH: 4.06 u[IU]/mL (ref 0.350–4.500)

## 2019-09-12 MED ORDER — SODIUM CHLORIDE 0.9 % IV BOLUS
500.0000 mL | Freq: Once | INTRAVENOUS | Status: AC
  Administered 2019-09-12: 500 mL via INTRAVENOUS

## 2019-09-12 MED ORDER — LEVOTHYROXINE SODIUM 50 MCG PO TABS
75.0000 ug | ORAL_TABLET | Freq: Every day | ORAL | Status: DC
  Administered 2019-09-14: 75 ug via ORAL
  Filled 2019-09-12: qty 1

## 2019-09-12 MED ORDER — ACETAMINOPHEN 325 MG PO TABS: 650.0000 mg | ORAL_TABLET | Freq: Four times a day (QID) | ORAL | Status: DC | PRN

## 2019-09-12 MED ORDER — ADULT MULTIVITAMIN W/MINERALS CH
1.0000 | ORAL_TABLET | Freq: Every day | ORAL | Status: DC
  Administered 2019-09-13 – 2019-09-14 (×2): 1 via ORAL
  Filled 2019-09-12 (×2): qty 1

## 2019-09-12 MED ORDER — ACETAMINOPHEN 650 MG RE SUPP: 650.0000 mg | Freq: Four times a day (QID) | RECTAL | Status: DC | PRN

## 2019-09-12 MED ORDER — TAMSULOSIN HCL 0.4 MG PO CAPS
0.8000 mg | ORAL_CAPSULE | Freq: Every day | ORAL | Status: DC
  Administered 2019-09-13: 0.8 mg via ORAL
  Filled 2019-09-12: qty 2

## 2019-09-12 MED ORDER — ONDANSETRON HCL 4 MG PO TABS: 4.0000 mg | ORAL_TABLET | Freq: Four times a day (QID) | ORAL | Status: DC | PRN

## 2019-09-12 MED ORDER — HYDROCHLOROTHIAZIDE 25 MG PO TABS
25.0000 mg | ORAL_TABLET | Freq: Every day | ORAL | Status: DC
  Administered 2019-09-13: 25 mg via ORAL
  Filled 2019-09-12: qty 1

## 2019-09-12 MED ORDER — SODIUM CHLORIDE 0.9% FLUSH: 3.0000 mL | Freq: Once | INTRAVENOUS | Status: DC

## 2019-09-12 MED ORDER — SODIUM CHLORIDE 0.9 % IV SOLN
1.0000 g | INTRAVENOUS | Status: DC
  Administered 2019-09-13 – 2019-09-14 (×2): 1 g via INTRAVENOUS
  Filled 2019-09-12: qty 10
  Filled 2019-09-12: qty 1

## 2019-09-12 MED ORDER — OXYBUTYNIN CHLORIDE 5 MG PO TABS
5.0000 mg | ORAL_TABLET | ORAL | Status: DC | PRN
  Administered 2019-09-13 – 2019-09-14 (×2): 5 mg via ORAL
  Filled 2019-09-12 (×4): qty 1

## 2019-09-12 MED ORDER — TRAZODONE HCL 50 MG PO TABS: 25.0000 mg | ORAL_TABLET | Freq: Every evening | ORAL | Status: DC | PRN

## 2019-09-12 MED ORDER — MAGNESIUM HYDROXIDE 400 MG/5ML PO SUSP: 30.0000 mL | Freq: Every day | ORAL | Status: DC | PRN

## 2019-09-12 MED ORDER — SODIUM CHLORIDE 0.9 % IV SOLN: INTRAVENOUS | Status: DC

## 2019-09-12 MED ORDER — ONDANSETRON HCL 4 MG/2ML IJ SOLN
4.0000 mg | Freq: Four times a day (QID) | INTRAMUSCULAR | Status: DC | PRN
  Administered 2019-09-13: 4 mg via INTRAVENOUS

## 2019-09-12 MED ORDER — POTASSIUM CHLORIDE 20 MEQ/15ML (10%) PO SOLN
40.0000 meq | Freq: Once | ORAL | Status: AC
  Administered 2019-09-12: 40 meq via ORAL
  Filled 2019-09-12: qty 30

## 2019-09-12 MED ORDER — TRAMADOL HCL 50 MG PO TABS
50.0000 mg | ORAL_TABLET | Freq: Four times a day (QID) | ORAL | Status: DC | PRN
  Administered 2019-09-13 (×2): 50 mg via ORAL
  Filled 2019-09-12 (×2): qty 1

## 2019-09-12 MED ORDER — POTASSIUM CHLORIDE 20 MEQ PO PACK
40.0000 meq | PACK | Freq: Once | ORAL | Status: AC
  Administered 2019-09-13: 40 meq via ORAL
  Filled 2019-09-12: qty 2

## 2019-09-12 MED ORDER — SIMVASTATIN 20 MG PO TABS
20.0000 mg | ORAL_TABLET | Freq: Every day | ORAL | Status: DC
  Administered 2019-09-13 (×2): 20 mg via ORAL
  Filled 2019-09-12 (×2): qty 1

## 2019-09-12 MED ORDER — CIPROFLOXACIN HCL 500 MG PO TABS: 500.0000 mg | ORAL_TABLET | Freq: Two times a day (BID) | ORAL | 0 refills | Status: DC

## 2019-09-12 NOTE — ED Provider Notes (Signed)
[facility] Emergency Department Provider Note   ____________________________________________    I have reviewed the triage vital signs and the nursing notes.   HISTORY  Chief Complaint Hematuria    HPI Nicholas Grimes is a 86 y.o. male with history of prostate issues, history of urethral stricture who presents with hematuria.  Patient reports that he had catheter placed several days ago because of bladder spasms had been doing okay.  Woke up this morning and noted to have significant red urine.  Denies abdominal pain to me.  Review of records demonstrates patient had urethral dilation on April 1 with Dr. Bernardo Heater, saw urology on May 25  Past Medical History:  Diagnosis Date  . Arthritis   . Cancer ([place])    Basal Cell Skin Cancer  . Hypertension   . Hypothyroidism   . Thyroid disease     Patient Active Problem List   Diagnosis Date Noted  . Urinary retention 12/09/2017  . Anemia 12/01/2017  . Hyponatremia 11/30/2017  . Hematuria 06/25/2016  . Hypercalcemia 06/25/2016  . Hypothyroidism due to acquired atrophy of thyroid 05/11/2015  . History of urethral stricture 03/03/2015  . Hyperlipidemia 11/05/2013  . Hypertension 11/05/2013  . Renal insufficiency 11/05/2013  . Subclinical hypothyroidism 11/05/2013  . BPH (benign prostatic hyperplasia) 05/01/2012    Past Surgical History:  Procedure Laterality Date  . CHOLECYSTECTOMY    . CYSTOSCOPY WITH URETHRAL DILATATION N/A 07/16/2019   Procedure: CYSTOSCOPY WITH URETHRAL DILATATION;  Surgeon: Abbie Sons, MD;  Location: [facility];  Service: Urology;  Laterality: N/A;  . INGUINAL HERNIA REPAIR Right 10/30/2016   Procedure: HERNIA REPAIR INGUINAL ADULT;  Surgeon: Leonie Green, MD;  Location: [facility];  Service: General;  Laterality: Right;  . INSERTION OF MESH  10/30/2016   Procedure: INSERTION OF MESH;  Surgeon: Leonie Green, MD;  Location: [facility];  Service: General;;  .  PROSTATE SURGERY      Prior to Admission medications   Medication Sig Start Date End Date Taking? Authorizing Provider  ciprofloxacin (CIPRO) 500 MG tablet Take 1 tablet (500 mg total) by mouth 2 (two) times daily for 10 days. 09/12/19 09/22/19  Lavonia Drafts, MD  hydrochlorothiazide (HYDRODIURIL) 25 MG tablet Take 25 mg by mouth daily.  01/09/18   [provider]  levothyroxine (SYNTHROID, LEVOTHROID) 75 MCG tablet Take 75 mcg by mouth daily before breakfast.     [provider]  Multiple Vitamin (MULTIVITAMIN WITH MINERALS) TABS tablet Take 1 tablet by mouth daily at 12 noon. Cemtrum Silver    [provider]  nitrofurantoin, macrocrystal-monohydrate, (MACROBID) 100 MG capsule Take 1 capsule (100 mg total) by mouth every 12 (twelve) hours for 10 days. 09/08/19 09/18/19  Debroah Loop, PA-C  oxybutynin (DITROPAN) 5 MG tablet Take 1 tablet (5 mg total) by mouth as needed for bladder spasms. 09/09/19   Vaillancourt, Aldona Bar, PA-C  simvastatin (ZOCOR) 20 MG tablet Take 20 mg by mouth at bedtime.     [provider]  tamsulosin (FLOMAX) 0.4 MG CAPS capsule Take 2 capsules (0.8 mg total) by mouth daily after supper. 12/02/17   Mayo, Pete Pelt, MD  traMADol (ULTRAM) 50 MG tablet Take 1 tablet (50 mg total) by mouth every 6 (six) hours as needed for moderate pain. 07/16/19   Stoioff, Ronda Fairly, MD     Allergies Patient has no known allergies.  Family History  Problem Relation Age of Onset  . Diabetes Mother   .  CAD Father     Social History Social History   Tobacco Use  . Smoking status: Former Smoker    Packs/day: 0.00    Types: Cigarettes, Cigars  . Smokeless tobacco: Former Network engineer Use Topics  . Alcohol use: No  . Drug use: No    Review of Systems  Constitutional: No fever/chills Eyes: No visual changes.  ENT: No sore throat. Cardiovascular: Denies chest pain. Respiratory: Denies shortness of breath. Gastrointestinal: No abdominal  pain.    Genitourinary: As above, Foley in place Musculoskeletal: Negative for back pain. Skin: Negative for rash. Neurological: Negative for headaches    ____________________________________________   PHYSICAL EXAM:  VITAL SIGNS: ED Triage Vitals  Enc Vitals Group     BP 09/12/19 0643 (!) 132/53     Pulse Rate 09/12/19 0643 83     Resp 09/12/19 1138 15     Temp 09/12/19 0643 97.9 F (36.6 C)     Temp Source 09/12/19 0643 Oral     SpO2 09/12/19 0643 100 %     Weight 09/12/19 0644 79.4 kg (175 lb)     Height 09/12/19 0644 1.829 m (6')     Head Circumference --      Peak Flow --      Pain Score 09/12/19 0644 5     Pain Loc --      Pain Edu? --      Excl. in [place]? --     Constitutional: Alert and oriented.   Nose: No congestion/rhinnorhea. Mouth/Throat: Mucous membranes are moist.    Cardiovascular: Normal rate, regular rhythm.  Good peripheral circulation. Respiratory: Normal respiratory effort.  No retractions. Gastrointestinal: Soft and nontender. No distention.  No CVA tenderness. Genitourinary: Foley catheter bag filled with dark red urine, no blood clots noted, no leakage around catheter Musculoskeletal:  Warm and well perfused Neurologic:  Normal speech and language. No gross focal neurologic deficits are appreciated.  Skin:  Skin is warm, dry and intact. No rash noted. Psychiatric: Mood and affect are normal. Speech and behavior are normal.  ____________________________________________   LABS (all labs ordered are listed, but only abnormal results are displayed)  Labs Reviewed  BASIC METABOLIC PANEL - Abnormal; Notable for the following components:      Result Value   Sodium 128 (*)    Potassium 3.0 (*)    Chloride 91 (*)    Glucose, Bld 161 (*)    All other components within normal limits  CBC - Abnormal; Notable for the following components:   WBC 3.7 (*)    RBC 4.18 (*)    Hemoglobin 12.6 (*)    HCT 36.7 (*)    All other components within normal  limits  URINALYSIS, COMPLETE (UACMP) WITH MICROSCOPIC  CBG MONITORING, ED   ____________________________________________  EKG  ED ECG REPORT I, Lavonia Drafts, the attending physician, personally viewed and interpreted this ECG.  Date: 09/12/2019  Rhythm: normal sinus rhythm QRS Axis: normal Intervals: normal ST/T Wave abnormalities: normal Narrative Interpretation: PVCs  ____________________________________________  RADIOLOGY  None ____________________________________________   PROCEDURES  Procedure(s) performed: No  Procedures   Critical Care performed: No ____________________________________________   INITIAL IMPRESSION / ASSESSMENT AND PLAN / ED COURSE  Pertinent labs & imaging results that were available during my care of the patient were reviewed by me and considered in my medical decision making (see chart for details).  Patient presents with gross hematuria, has Foley in place.  History of bleeding from prostate before.  Have asked nurse to irrigate bladder with saline, will reevaluate.  Hemoglobin stable, lab work otherwise noncontributory  Unable to irrigate Foley, suspect blood clots blocking withdrawal of fluid.  Discussed with Dr. Jeffie Pollock of urology who recommends insertion of 20 French catheter or 18 if that will not fit.  Nurse excessively placed 20 French Foley, will observe in the department to be sure this does not clot off  Patient had urine culture from May 25 which demonstrates greater than 100,000 colony-forming units, antibiotics prescribed    ____________________________________________   FINAL CLINICAL IMPRESSION(S) / ED DIAGNOSES  Final diagnoses:  Gross hematuria        Note:  This document was prepared using Dragon voice recognition software and may include unintentional dictation errors.   Lavonia Drafts, MD 09/12/19 1524

## 2019-09-12 NOTE — ED Triage Notes (Signed)
Pt from home via [facility]. Pt reports blood in catheter bag that "will not empty d/t being clotted off". Pt reports pain at site of catheter.  Pt reports prostate surgery 07/16/2019.  EMS VS: HR 84, in NSR with intermittent bigeminy; 98% on RA; CO2 28; RR 14 unlabored but SHOB; BP 144/58.

## 2019-09-12 NOTE — ED Notes (Signed)
Catheter removed by ER staff. This RN called to have urology cart brought up to ER for pt.

## 2019-09-12 NOTE — ED Notes (Signed)
ED Provider at bedside. 

## 2019-09-12 NOTE — ED Provider Notes (Signed)
Dr. Jeffie Pollock at bedside for consult   Nicholas Kitten, MD 09/12/19 [phone]

## 2019-09-12 NOTE — ED Notes (Signed)
Pt ambulated to bathroom with assistance from this tech. Pt emptied leg bag. Urine red with clots. Hand hygiene preformed. Pt ambulatory back to SWA and sat in recliner. Call light within reach. Pt has no further needs at this time.

## 2019-09-12 NOTE — ED Provider Notes (Signed)
Patient resting comfortably, draining pink-tinged urine.  Continue to drain well putting out approximately 400 cc of urine.  No clots.  Patient denies pain or discomfort.  Fully awake and alert  Discussed plan to place him on antibiotics Cipro, but he points out to me that he was actually just started and is been on Macrobid for 2 days now.  I was not aware of this, and have paged urology to discuss further treatment recommendations, as I doubt he would require treatment to antibiotic.  Culture data that I can see does not point to a specific organism that was recently taken however.  Perhaps urology will have more data  Discused with Dr. Jeffie Pollock; 100,000 cfu but no organism yet. Prior sensitive enterococcus to macrobid, recommends NO use cipro but continue macrobid. Dr. Joella Prince continue with regular follow-up with Urology, call Tuesdaay to setup new appointment. Dr. Jeffie Pollock will also notify Dr. Bernardo Heater of ED visit  Patient is comfortable with the plan to discharge with careful return precautions and urology follow-up.   Delman Kitten, MD 09/12/19 [phone]

## 2019-09-12 NOTE — H&P (Signed)
[facility] [place] NAME: Nicholas Grimes    MR#:  [id number]  DATE OF BIRTH:  [birth year]  DATE OF ADMISSION:  09/12/2019  PRIMARY CARE PHYSICIAN: Baxter Hire, MD   REQUESTING/REFERRING PHYSICIAN: Delman Kitten, MD  CHIEF COMPLAINT:   Chief Complaint  Patient presents with  . Weakness  . Hematuria    HISTORY OF PRESENT ILLNESS:  Nicholas Grimes  is a 86 y.o. male with a known history of hypertension, hypothyroidism and osteoarthritis, presents emergency room with acute onset of hematuria that started at 4 AM today with associated mild dysuria and bladder spasms.  He was recently given prescription for nitrofurantoin for UTI and positive urine culture with sensitivities currently pending.Marland Kitchen He denies any fever or chills.  No nausea or vomiting or abdominal pain.  He felt lightheadedness and mild dizziness today when he got up from lying down.  No chest pain or dyspnea or palpitations.  No cough or wheezing.  Upon presentation to the emergency room, blood pressure was 132/53 with otherwise normal vital signs.  Later blood pressure was 171/81. Labs were remarkable for hyponatremia 128 and hypochloremia of 91 as well as hypokalemia of 3.  CBC showed hemoglobin of 11 and hematocrit 32.2 down from 12.6 and 36.7 this morning with platelets of 193 and WBC of 6. EKG showed normal sinus rhythm with a rate of 84 with premature supraventricular complexes as well as PVCs, left axis deviation, minimal voltage criteria for LVH.  The patient had bladder irrigation by Dr. Jeffie Pollock in the ER for his hemorrhagic cystitis with recurrent clot retention and was irrigated free of clots.  The patient will be admitted to observation medical monitored bed for further evaluation and management.  PAST MEDICAL HISTORY:   Past Medical History:  Diagnosis Date  . Arthritis   . Cancer ([place])    Basal Cell Skin Cancer  . Hypertension   . Hypothyroidism   . Thyroid disease     PAST  SURGICAL HISTORY:   Past Surgical History:  Procedure Laterality Date  . CHOLECYSTECTOMY    . CYSTOSCOPY WITH URETHRAL DILATATION N/A 07/16/2019   Procedure: CYSTOSCOPY WITH URETHRAL DILATATION;  Surgeon: Abbie Sons, MD;  Location: [facility] ORS;  Service: Urology;  Laterality: N/A;  . INGUINAL HERNIA REPAIR Right 10/30/2016   Procedure: HERNIA REPAIR INGUINAL ADULT;  Surgeon: Leonie Green, MD;  Location: [facility] ORS;  Service: General;  Laterality: Right;  . INSERTION OF MESH  10/30/2016   Procedure: INSERTION OF MESH;  Surgeon: Leonie Green, MD;  Location: [facility] ORS;  Service: General;;  . PROSTATE SURGERY      SOCIAL HISTORY:   Social History   Tobacco Use  . Smoking status: Former Smoker    Packs/day: 0.00    Types: Cigarettes, Cigars  . Smokeless tobacco: Former Network engineer Use Topics  . Alcohol use: No    FAMILY HISTORY:   Family History  Problem Relation Age of Onset  . Diabetes Mother   . CAD Father     DRUG ALLERGIES:  No Known Allergies  REVIEW OF SYSTEMS:   ROS As per history of present illness. All pertinent systems were reviewed above. Constitutional,  HEENT, cardiovascular, respiratory, GI, GU, musculoskeletal, neuro, psychiatric, endocrine,  integumentary and hematologic systems were reviewed and are otherwise  negative/unremarkable except for positive findings mentioned above in the HPI.   MEDICATIONS AT HOME:   Prior to Admission medications   Medication Sig Start Date  End Date Taking? Authorizing Provider  hydrochlorothiazide (HYDRODIURIL) 25 MG tablet Take 25 mg by mouth daily.  01/09/18   [provider]  levothyroxine (SYNTHROID, LEVOTHROID) 75 MCG tablet Take 75 mcg by mouth daily before breakfast.     [provider]  Multiple Vitamin (MULTIVITAMIN WITH MINERALS) TABS tablet Take 1 tablet by mouth daily at 12 noon. Cemtrum Silver    [provider]  nitrofurantoin, macrocrystal-monohydrate, (MACROBID)  100 MG capsule Take 1 capsule (100 mg total) by mouth every 12 (twelve) hours for 10 days. 09/08/19 09/18/19  Debroah Loop, PA-C  oxybutynin (DITROPAN) 5 MG tablet Take 1 tablet (5 mg total) by mouth as needed for bladder spasms. 09/09/19   Vaillancourt, Aldona Bar, PA-C  simvastatin (ZOCOR) 20 MG tablet Take 20 mg by mouth at bedtime.     [provider]  tamsulosin (FLOMAX) 0.4 MG CAPS capsule Take 2 capsules (0.8 mg total) by mouth daily after supper. 12/02/17   Mayo, Pete Pelt, MD  traMADol (ULTRAM) 50 MG tablet Take 1 tablet (50 mg total) by mouth every 6 (six) hours as needed for moderate pain. 07/16/19   Stoioff, Ronda Fairly, MD      VITAL SIGNS:  Blood pressure 109/63, pulse (!) 51, temperature 97.9 F (36.6 C), temperature source Oral, resp. rate 16, height 6' (1.829 m), weight 79.4 kg, SpO2 100 %.  PHYSICAL EXAMINATION:  Physical Exam  GENERAL:  86 y.o.-year-old Caucasian male patient lying in the bed with no acute distress.  EYES: Pupils equal, round, reactive to light and accommodation. No scleral icterus. Extraocular muscles intact.  HEENT: Head atraumatic, normocephalic. Oropharynx and nasopharynx clear.  NECK:  Supple, no jugular venous distention. No thyroid enlargement, no tenderness.  LUNGS: Normal breath sounds bilaterally, no wheezing, rales,rhonchi or crepitation. No use of accessory muscles of respiration.  CARDIOVASCULAR: Regular rate and rhythm, S1, S2 normal. No murmurs, rubs, or gallops.  ABDOMEN: Soft, nondistended, nontender. Bowel sounds present. No organomegaly or mass.  EXTREMITIES: No pedal edema, cyanosis, or clubbing.  NEUROLOGIC: Cranial nerves II through XII are intact. Muscle strength 5/5 in all extremities. Sensation intact. Gait not checked.  PSYCHIATRIC: The patient is alert and oriented x 3.  Normal affect and good eye contact. SKIN: No obvious rash, lesion, or ulcer.   LABORATORY PANEL:   CBC Recent Labs  Lab 09/12/19 2031  WBC 6.0    HGB 11.0*  HCT 32.2*  PLT 193   ------------------------------------------------------------------------------------------------------------------  Chemistries  Recent Labs  Lab 09/12/19 0656  NA 128*  K 3.0*  CL 91*  CO2 26  GLUCOSE 161*  BUN 13  CREATININE 1.03  CALCIUM 9.0   ------------------------------------------------------------------------------------------------------------------  Cardiac Enzymes No results for input(s): TROPONINI in the last 168 hours. ------------------------------------------------------------------------------------------------------------------  RADIOLOGY:  No results found.    IMPRESSION AND PLAN:   1.  Hematuria with acute blood loss anemia.  He had mild orthostatic hypotension. -The patient will be admitted to an observation medical monitored bed. -We will follow serial hemoglobins and hematocrits. -The patient underwent bladder irrigation and will be monitored for further bleeding, follow-up of his hemoglobin hematocrit in addition to hyponatremia. -Urology follow-up can be done on an outpatient basis with Dr. Bernardo Heater on Tuesday as arranged by Dr Jeffie Pollock.  The case was discussed with him. -We will follow his orthostatics.  2.  Hypokalemia. -Potassium will be placed in magnesium level will be checked.  3.  UTI with hemorrhagic cystitis. -We will place the patient on IV Rocephin and follow  urine culture and sensitivity.  4.  Hyponatremia -This is likely hypovolemic due to volume depletion. -We will hydrate with IV normal saline and follow BMP.  5.  Hypothyroidism. -Continue Synthroid and check TSH level.  6.  Dyslipidemia. -We will continue statin therapy.  7.  BPH and overactive bladder. -We will continue Flomax and Ditropan.  8.  Hypertension. -We will hold off his HCTZ at this time due to borderline blood pressure and place him on as needed IV labetalol.  9.  DVT prophylaxis. -SCDs. -Medical prophylaxis currently  collocated due to his hematuria.   All the records are reviewed and case discussed with ED provider. The plan of care was discussed in details with the patient (and family). I answered all questions. The patient agreed to proceed with the above mentioned plan. Further management will depend upon hospital course.   CODE STATUS: Full code  Status is: Observation  The patient remains OBS appropriate and will d/c before 2 midnights.  Dispo: The patient is from: Home              Anticipated d/c is to: Home              Anticipated d/c date is: 1 day              Patient currently is not medically stable to d/c.    TOTAL TIME TAKING CARE OF THIS PATIENT: 55 minutes.    Christel Mormon M.D on 09/12/2019 at 9:49 PM  [facility]   From 7 PM-7 AM, contact night-coverage [email or web address]  CC: Primary care physician; Baxter Hire, MD   Note: This dictation was prepared with Dragon dictation along with smaller phrase technology. Any transcriptional errors that result from this process are unintentional.

## 2019-09-12 NOTE — ED Notes (Signed)
This RN and Ambulance person emptied 11mL urine from catheter, with obvious red blood and dark red blood clots. Pt tolerated well and verbalized slight relief from pain.

## 2019-09-12 NOTE — ED Notes (Signed)
Caryl Pina RN at bedside to attempt bladder irrigation a second time.

## 2019-09-12 NOTE — Consult Note (Signed)
Subjective: 1. Gross hematuria      Nicholas Grimes is an 86 yo male who is followed by Dr. Bernardo Heater for many years.  He has a history of a urethral stricture that was just dilated on 07/16/19.   He had been on CIC to maintain patency but stopped because of hematuria.   He was back in the office on 5/25 with gross hematuria and an elevated PVR of 213ml. He had a 15fr foley placed.   He presented to the ER this morning with pain and clot retention.  The foley was irrigated but eventually became completely obstructed and was replaced with a 88fr.  This was irrigated and the patient was about the be discharged when the foley obstructed again.  He is having considerable pain with bladder spasms.   He has no associated signs or symptoms.   He has a had a prior TURP.  His culture on 5/25 is positive but the sensitivities are pending.  He is no nitrofurantoin.  ROS:  Review of Systems  All other systems reviewed and are negative.   No Known Allergies  Past Medical History:  Diagnosis Date  . Arthritis   . Cancer ([place])    Basal Cell Skin Cancer  . Hypertension   . Hypothyroidism   . Thyroid disease     Past Surgical History:  Procedure Laterality Date  . CHOLECYSTECTOMY    . CYSTOSCOPY WITH URETHRAL DILATATION N/A 07/16/2019   Procedure: CYSTOSCOPY WITH URETHRAL DILATATION;  Surgeon: Abbie Sons, MD;  Location: [facility] ORS;  Service: Urology;  Laterality: N/A;  . INGUINAL HERNIA REPAIR Right 10/30/2016   Procedure: HERNIA REPAIR INGUINAL ADULT;  Surgeon: Leonie Green, MD;  Location: [facility] ORS;  Service: General;  Laterality: Right;  . INSERTION OF MESH  10/30/2016   Procedure: INSERTION OF MESH;  Surgeon: Leonie Green, MD;  Location: [facility] ORS;  Service: General;;  . PROSTATE SURGERY      Social History   Socioeconomic History  . Marital status: Married    Spouse name: Not on file  . Number of children: Not on file  . Years of education: Not on file  . Highest education  level: Not on file  Occupational History  . Not on file  Tobacco Use  . Smoking status: Former Smoker    Packs/day: 0.00    Types: Cigarettes, Cigars  . Smokeless tobacco: Former Network engineer and Sexual Activity  . Alcohol use: No  . Drug use: No  . Sexual activity: Not on file  Other Topics Concern  . Not on file  Social History Narrative  . Not on file   Social Determinants of Health   Financial Resource Strain:   . Difficulty of Paying Living Expenses:   Food Insecurity:   . Worried About Charity fundraiser in the Last Year:   . Arboriculturist in the Last Year:   Transportation Needs:   . Film/video editor (Medical):   Marland Kitchen Lack of Transportation (Non-Medical):   Physical Activity:   . Days of Exercise per Week:   . Minutes of Exercise per Session:   Stress:   . Feeling of Stress :   Social Connections:   . Frequency of Communication with Friends and Family:   . Frequency of Social Gatherings with Friends and Family:   . Attends Religious Services:   . Active Member of Clubs or Organizations:   . Attends Archivist Meetings:   .  Marital Status:   Intimate Partner Violence:   . Fear of Current or Ex-Partner:   . Emotionally Abused:   Marland Kitchen Physically Abused:   . Sexually Abused:     Family History  Problem Relation Age of Onset  . Diabetes Mother   . CAD Father     Anti-infectives: Anti-infectives (From admission, onward)   Start     Dose/Rate Route Frequency Ordered Stop   09/12/19 0000  ciprofloxacin (CIPRO) 500 MG tablet  Status:  Discontinued     500 mg Oral 2 times daily 09/12/19 1512 09/12/19       Current Facility-Administered Medications  Medication Dose Route Frequency Provider Last Rate Last Admin  . sodium chloride flush (NS) 0.9 % injection 3 mL  3 mL Intravenous Once Lavonia Drafts, MD       Current Outpatient Medications  Medication Sig Dispense Refill  . hydrochlorothiazide (HYDRODIURIL) 25 MG tablet Take 25 mg by mouth  daily.   10  . levothyroxine (SYNTHROID, LEVOTHROID) 75 MCG tablet Take 75 mcg by mouth daily before breakfast.     . Multiple Vitamin (MULTIVITAMIN WITH MINERALS) TABS tablet Take 1 tablet by mouth daily at 12 noon. Cemtrum Silver    . nitrofurantoin, macrocrystal-monohydrate, (MACROBID) 100 MG capsule Take 1 capsule (100 mg total) by mouth every 12 (twelve) hours for 10 days. 20 capsule 0  . oxybutynin (DITROPAN) 5 MG tablet Take 1 tablet (5 mg total) by mouth as needed for bladder spasms. 30 tablet 0  . simvastatin (ZOCOR) 20 MG tablet Take 20 mg by mouth at bedtime.     . tamsulosin (FLOMAX) 0.4 MG CAPS capsule Take 2 capsules (0.8 mg total) by mouth daily after supper. 30 capsule 0  . traMADol (ULTRAM) 50 MG tablet Take 1 tablet (50 mg total) by mouth every 6 (six) hours as needed for moderate pain. 15 tablet 0     Objective: Vital signs in last 24 hours: BP (!) 168/75 (BP Location: Right Arm)   Pulse (!) 54   Temp 97.9 F (36.6 C) (Oral)   Resp 15   Ht 6' (1.829 m)   Wt 79.4 kg   SpO2 100%   BMI 23.73 kg/m   Intake/Output from previous day: No intake/output data recorded. Intake/Output this shift: No intake/output data recorded.   Physical Exam Vitals reviewed.  Constitutional:      Appearance: Normal appearance.  Cardiovascular:     Rate and Rhythm: Normal rate and regular rhythm.  Pulmonary:     Effort: No respiratory distress.  Abdominal:     General: Abdomen is flat.     Tenderness: There is abdominal tenderness (suprapubic).  Genitourinary:    Comments: Circumcised phallus with blood at the meatus and a foley in place.  Musculoskeletal:        General: No swelling or tenderness. Normal range of motion.  Skin:    General: Skin is warm and dry.  Neurological:     General: No focal deficit present.     Mental Status: He is alert and oriented to person, place, and time.  Psychiatric:        Mood and Affect: Mood normal.        Behavior: Behavior normal.      Lab Results:  Results for orders placed or performed during the hospital encounter of 09/12/19 (from the past 24 hour(s))  Basic metabolic panel     Status: Abnormal   Collection Time: 09/12/19  6:56 AM  Result Value  Ref Range   Sodium 128 (L) 135 - 145 mmol/L   Potassium 3.0 (L) 3.5 - 5.1 mmol/L   Chloride 91 (L) 98 - 111 mmol/L   CO2 26 22 - 32 mmol/L   Glucose, Bld 161 (H) 70 - 99 mg/dL   BUN 13 8 - 23 mg/dL   Creatinine, Ser 1.03 0.61 - 1.24 mg/dL   Calcium 9.0 8.9 - 10.3 mg/dL   GFR calc non Af Amer >60 >60 mL/min   GFR calc Af Amer >60 >60 mL/min   Anion gap 11 5 - 15  CBC     Status: Abnormal   Collection Time: 09/12/19  6:56 AM  Result Value Ref Range   WBC 3.7 (L) 4.0 - 10.5 K/uL   RBC 4.18 (L) 4.22 - 5.81 MIL/uL   Hemoglobin 12.6 (L) 13.0 - 17.0 g/dL   HCT 36.7 (L) 39.0 - 52.0 %   MCV 87.8 80.0 - 100.0 fL   MCH 30.1 26.0 - 34.0 pg   MCHC 34.3 30.0 - 36.0 g/dL   RDW 13.4 11.5 - 15.5 %   Platelets 189 150 - 400 K/uL   nRBC 0.0 0.0 - 0.2 %    BMET Recent Labs    09/12/19 0656  NA 128*  K 3.0*  CL 91*  CO2 26  GLUCOSE 161*  BUN 13  CREATININE 1.03  CALCIUM 9.0   PT/INR No results for input(s): LABPROT, INR in the last 72 hours. ABG No results for input(s): PHART, HCO3 in the last 72 hours.  Invalid input(s): PCO2, PO2  Studies/Results:  His foley was irrigated with 3070ml of sterile fluid with return of about 280ml of old clot.   The return eventual became crystal clear and didn't bloody back up which is consistent with old clot and no active bleeding.    Assessment/Plan: History of BPH with a urethral stricture and hemorrhagic cystitis with recurrent clot retention.  I was able to irrigate him free of clots and there doesn't appear to be active bleeding.   He should be able to be discharged from the GU standpoint.   Hyponatremia.  His sodium is 128 but it was 130 in 11/2017.       I have sent a message to have Dr. Bernardo Heater contact the patient  on Tuesday.       CC: Dr. Lavonia Drafts and Dr. Adelyna Brockman Giovanni.      Irine Seal 09/12/2019 [phone]

## 2019-09-12 NOTE — ED Notes (Signed)
Royce RN at bedside to attempt to irrigate foley cath.

## 2019-09-12 NOTE — ED Notes (Signed)
Assisted pt to use toilet and he stated he felt very faint and weak.  BP 90/61.  Dr Jacqualine Code alert, orders entered.

## 2019-09-13 ENCOUNTER — Encounter: Payer: Self-pay | Admitting: Family Medicine

## 2019-09-13 ENCOUNTER — Inpatient Hospital Stay: Payer: Medicare Other | Admitting: Registered Nurse

## 2019-09-13 ENCOUNTER — Encounter
Admission: EM | Disposition: A | Discharge status: Home Health | Payer: Self-pay | Source: Home / Self Care | Attending: Internal Medicine

## 2019-09-13 ENCOUNTER — Inpatient Hospital Stay: Payer: Medicare Other

## 2019-09-13 DIAGNOSIS — R5381 Other malaise: Secondary | ICD-10-CM | POA: Diagnosis not present

## 2019-09-13 DIAGNOSIS — E876 Hypokalemia: Secondary | ICD-10-CM | POA: Diagnosis present

## 2019-09-13 DIAGNOSIS — N368 Other specified disorders of urethra: Secondary | ICD-10-CM | POA: Diagnosis present

## 2019-09-13 DIAGNOSIS — M1990 Unspecified osteoarthritis, unspecified site: Secondary | ICD-10-CM | POA: Diagnosis present

## 2019-09-13 DIAGNOSIS — I959 Hypotension, unspecified: Secondary | ICD-10-CM | POA: Diagnosis not present

## 2019-09-13 DIAGNOSIS — E785 Hyperlipidemia, unspecified: Secondary | ICD-10-CM | POA: Diagnosis present

## 2019-09-13 DIAGNOSIS — E861 Hypovolemia: Secondary | ICD-10-CM | POA: Diagnosis present

## 2019-09-13 DIAGNOSIS — I951 Orthostatic hypotension: Secondary | ICD-10-CM | POA: Diagnosis present

## 2019-09-13 DIAGNOSIS — N3281 Overactive bladder: Secondary | ICD-10-CM | POA: Diagnosis present

## 2019-09-13 DIAGNOSIS — N401 Enlarged prostate with lower urinary tract symptoms: Secondary | ICD-10-CM | POA: Diagnosis present

## 2019-09-13 DIAGNOSIS — I493 Ventricular premature depolarization: Secondary | ICD-10-CM | POA: Diagnosis present

## 2019-09-13 DIAGNOSIS — Z87891 Personal history of nicotine dependence: Secondary | ICD-10-CM | POA: Diagnosis not present

## 2019-09-13 DIAGNOSIS — Z9079 Acquired absence of other genital organ(s): Secondary | ICD-10-CM | POA: Diagnosis not present

## 2019-09-13 DIAGNOSIS — N3091 Cystitis, unspecified with hematuria: Secondary | ICD-10-CM | POA: Diagnosis present

## 2019-09-13 DIAGNOSIS — E878 Other disorders of electrolyte and fluid balance, not elsewhere classified: Secondary | ICD-10-CM | POA: Diagnosis present

## 2019-09-13 DIAGNOSIS — Z79890 Hormone replacement therapy (postmenopausal): Secondary | ICD-10-CM | POA: Diagnosis not present

## 2019-09-13 DIAGNOSIS — E871 Hypo-osmolality and hyponatremia: Secondary | ICD-10-CM

## 2019-09-13 DIAGNOSIS — D62 Acute posthemorrhagic anemia: Secondary | ICD-10-CM | POA: Diagnosis present

## 2019-09-13 DIAGNOSIS — I10 Essential (primary) hypertension: Secondary | ICD-10-CM | POA: Diagnosis present

## 2019-09-13 DIAGNOSIS — R338 Other retention of urine: Secondary | ICD-10-CM

## 2019-09-13 DIAGNOSIS — R310 Gross hematuria: Secondary | ICD-10-CM

## 2019-09-13 DIAGNOSIS — Z85828 Personal history of other malignant neoplasm of skin: Secondary | ICD-10-CM | POA: Diagnosis not present

## 2019-09-13 DIAGNOSIS — Z20822 Contact with and (suspected) exposure to covid-19: Secondary | ICD-10-CM | POA: Diagnosis present

## 2019-09-13 DIAGNOSIS — E039 Hypothyroidism, unspecified: Secondary | ICD-10-CM | POA: Diagnosis present

## 2019-09-13 DIAGNOSIS — N3289 Other specified disorders of bladder: Secondary | ICD-10-CM | POA: Diagnosis present

## 2019-09-13 DIAGNOSIS — I491 Atrial premature depolarization: Secondary | ICD-10-CM | POA: Diagnosis present

## 2019-09-13 DIAGNOSIS — Z79899 Other long term (current) drug therapy: Secondary | ICD-10-CM | POA: Diagnosis not present

## 2019-09-13 DIAGNOSIS — Z8249 Family history of ischemic heart disease and other diseases of the circulatory system: Secondary | ICD-10-CM | POA: Diagnosis not present

## 2019-09-13 HISTORY — PX: CYSTOSCOPY WITH FULGERATION: SHX6638

## 2019-09-13 LAB — BASIC METABOLIC PANEL
Anion gap: 6 (ref 5–15)
BUN: 15 mg/dL (ref 8–23)
CO2: 27 mmol/L (ref 22–32)
Calcium: 8.4 mg/dL — ABNORMAL LOW (ref 8.9–10.3)
Chloride: 99 mmol/L (ref 98–111)
Creatinine, Ser: 0.93 mg/dL (ref 0.61–1.24)
GFR calc Af Amer: >60 mL/min (ref >60)
GFR calc non Af Amer: >60 mL/min (ref >60)
Glucose, Bld: 114 mg/dL — ABNORMAL HIGH (ref 70–99)
Potassium: 3.8 mmol/L (ref 3.5–5.1)
Sodium: 132 mmol/L — ABNORMAL LOW (ref 135–145)

## 2019-09-13 LAB — HEMOGLOBIN AND HEMATOCRIT, BLOOD
HCT: 30.5 % — ABNORMAL LOW (ref 39.0–52.0)
HCT: 28.9 % — ABNORMAL LOW (ref 39.0–52.0)
Hemoglobin: 10.7 g/dL — ABNORMAL LOW (ref 13.0–17.0)
Hemoglobin: 10.2 g/dL — ABNORMAL LOW (ref 13.0–17.0)

## 2019-09-13 LAB — CBC
HCT: 31.1 % — ABNORMAL LOW (ref 39.0–52.0)
Hemoglobin: 11 g/dL — ABNORMAL LOW (ref 13.0–17.0)
MCH: 30.5 pg (ref 26.0–34.0)
MCHC: 35.4 g/dL (ref 30.0–36.0)
MCV: 86.1 fL (ref 80.0–100.0)
Platelets: 187 10*3/uL (ref 150–400)
RBC: 3.61 MIL/uL — ABNORMAL LOW (ref 4.22–5.81)
RDW: 13.5 % (ref 11.5–15.5)
WBC: 4.4 10*3/uL (ref 4.0–10.5)
nRBC: 0 % (ref 0.0–0.2)

## 2019-09-13 LAB — MAGNESIUM: Magnesium: 1.9 mg/dL (ref 1.7–2.4)

## 2019-09-13 SURGERY — CYSTOSCOPY, WITH BLADDER FULGURATION
Anesthesia: General

## 2019-09-13 MED ORDER — PHENYLEPHRINE HCL (PRESSORS) 10 MG/ML IV SOLN
INTRAVENOUS | Status: DC | PRN
  Administered 2019-09-13: 100 ug via INTRAVENOUS

## 2019-09-13 MED ORDER — GLYCOPYRROLATE 0.2 MG/ML IJ SOLN
INTRAMUSCULAR | Status: DC | PRN
  Administered 2019-09-13: .2 mg via INTRAVENOUS

## 2019-09-13 MED ORDER — SODIUM CHLORIDE 0.9 % IR SOLN
3000.0000 mL | Status: DC
  Administered 2019-09-13 – 2019-09-14 (×5): 3000 mL

## 2019-09-13 MED ORDER — FINASTERIDE 5 MG PO TABS
5.0000 mg | ORAL_TABLET | Freq: Every day | ORAL | Status: DC
  Administered 2019-09-14: 5 mg via ORAL
  Filled 2019-09-13: qty 1

## 2019-09-13 MED ORDER — CHLORHEXIDINE GLUCONATE CLOTH 2 % EX PADS
6.0000 | MEDICATED_PAD | Freq: Every day | CUTANEOUS | Status: DC
  Administered 2019-09-13 – 2019-09-14 (×2): 6 via TOPICAL

## 2019-09-13 MED ORDER — LACTATED RINGERS IV SOLN: INTRAVENOUS | Status: DC | PRN

## 2019-09-13 MED ORDER — FENTANYL CITRATE (PF) 100 MCG/2ML IJ SOLN: 25.0000 ug | INTRAMUSCULAR | Status: DC | PRN

## 2019-09-13 MED ORDER — FENTANYL CITRATE (PF) 100 MCG/2ML IJ SOLN
INTRAMUSCULAR | Status: AC
  Filled 2019-09-13: qty 2

## 2019-09-13 MED ORDER — POTASSIUM CHLORIDE 20 MEQ/15ML (10%) PO SOLN
40.0000 meq | Freq: Once | ORAL | Status: AC
  Administered 2019-09-13: 40 meq via ORAL
  Filled 2019-09-13: qty 30

## 2019-09-13 MED ORDER — FENTANYL CITRATE (PF) 100 MCG/2ML IJ SOLN
INTRAMUSCULAR | Status: DC | PRN
  Administered 2019-09-13: 25 ug via INTRAVENOUS

## 2019-09-13 MED ORDER — PROPOFOL 10 MG/ML IV BOLUS
INTRAVENOUS | Status: DC | PRN
  Administered 2019-09-13: 130 mg via INTRAVENOUS

## 2019-09-13 MED ORDER — ONDANSETRON HCL 4 MG/2ML IJ SOLN: 4.0000 mg | Freq: Once | INTRAMUSCULAR | Status: DC | PRN

## 2019-09-13 MED ORDER — PROPOFOL 10 MG/ML IV BOLUS
INTRAVENOUS | Status: AC
  Filled 2019-09-13: qty 20

## 2019-09-13 MED ORDER — EPHEDRINE SULFATE 50 MG/ML IJ SOLN
INTRAMUSCULAR | Status: DC | PRN
  Administered 2019-09-13: 15 mg via INTRAVENOUS

## 2019-09-13 MED ORDER — LIDOCAINE HCL (CARDIAC) PF 100 MG/5ML IV SOSY
PREFILLED_SYRINGE | INTRAVENOUS | Status: DC | PRN
  Administered 2019-09-13: 80 mg via INTRAVENOUS

## 2019-09-13 MED ORDER — PHENYLEPHRINE HCL (PRESSORS) 10 MG/ML IV SOLN: INTRAVENOUS | Status: DC | PRN

## 2019-09-13 SURGICAL SUPPLY — 10 items
BAG DRAIN CYSTO-URO LG1000N (MISCELLANEOUS) ×3 IMPLANT
DRAPE XRAY CASSETTE 23X24 (DRAPES) ×3 IMPLANT
GLOVE BIO SURGEON STRL SZ8 (GLOVE) ×3 IMPLANT
GOWN STRL REUS W/ TWL LRG LVL3 (GOWN DISPOSABLE) ×1 IMPLANT
GOWN STRL REUS W/TWL LRG LVL3 (GOWN DISPOSABLE) ×2
KIT TURNOVER CYSTO (KITS) ×3 IMPLANT
PACK CYSTO AR (MISCELLANEOUS) ×3 IMPLANT
SET CYSTO W/LG BORE CLAMP LF (SET/KITS/TRAYS/PACK) ×3 IMPLANT
WATER STERILE IRR 1000ML POUR (IV SOLUTION) ×3 IMPLANT
WATER STERILE IRR 3000ML UROMA (IV SOLUTION) ×3 IMPLANT

## 2019-09-13 NOTE — Discharge Instructions (Signed)
Indwelling Urinary Catheter Care, Adult An indwelling urinary catheter is a thin tube that is put into your bladder. The tube helps to drain pee (urine) out of your body. The tube goes in through your urethra. Your urethra is where pee comes out of your body. Your pee will come out through the catheter, then it will go into a bag (drainage bag). Take good care of your catheter so it will work well. How to wear your catheter and bag Supplies needed  Sticky tape (adhesive tape) or a leg strap.  Alcohol wipe or soap and water (if you use tape).  A clean towel (if you use tape).  Large overnight bag.  Smaller bag (leg bag). Wearing your catheter Attach your catheter to your leg with tape or a leg strap.  Make sure the catheter is not pulled tight.  If a leg strap gets wet, take it off and put on a dry strap.  If you use tape to hold the bag on your leg: 1. Use an alcohol wipe or soap and water to wash your skin where the tape made it sticky before. 2. Use a clean towel to pat-dry that skin. 3. Use new tape to make the bag stay on your leg. Wearing your bags You should have been given a large overnight bag.  You may wear the overnight bag in the day or night.  Always have the overnight bag lower than your bladder.  Do not let the bag touch the floor.  Before you go to sleep, put a clean plastic bag in a wastebasket. Then hang the overnight bag inside the wastebasket. You should also have a smaller leg bag that fits under your clothes.  Always wear the leg bag below your knee.  Do not wear your leg bag at night. How to care for your skin and catheter Supplies needed  A clean washcloth.  Water and mild soap.  A clean towel. Caring for your skin and catheter      Clean the skin around your catheter every day: 1. Wash your hands with soap and water. 2. Wet a clean washcloth in warm water and mild soap. 3. Clean the skin around your urethra.  If you are  male:  Gently spread the folds of skin around your vagina (labia).  With the washcloth in your other hand, wipe the inner side of your labia on each side. Wipe from front to back.  If you are male:  Pull back any skin that covers the end of your penis (foreskin).  With the washcloth in your other hand, wipe your penis in small circles. Start wiping at the tip of your penis, then move away from the catheter.  Move the foreskin back in place, if needed. 4. With your free hand, hold the catheter close to where it goes into your body.  Keep holding the catheter during cleaning so it does not get pulled out. 5. With the washcloth in your other hand, clean the catheter.  Only wipe downward on the catheter.  Do not wipe upward toward your body. Doing this may push germs into your urethra and cause infection. 6. Use a clean towel to pat-dry the catheter and the skin around it. Make sure to wipe off all soap. 7. Wash your hands with soap and water.  Shower every day. Do not take baths.  Do not use cream, ointment, or lotion on the area where the catheter goes into your body, unless your doctor tells you   to.  Do not use powders, sprays, or lotions on your genital area.  Check your skin around the catheter every day for signs of infection. Check for: ? Redness, swelling, or pain. ? Fluid or blood. ? Warmth. ? Pus or a bad smell. How to empty the bag Supplies needed  Rubbing alcohol.  Gauze pad or cotton ball.  Tape or a leg strap. Emptying the bag Pour the pee out of your bag when it is ?- full, or at least 2-3 times a day. Do this for your overnight bag and your leg bag. 1. Wash your hands with soap and water. 2. Separate (detach) the bag from your leg. 3. Hold the bag over the toilet or a clean pail. Keep the bag lower than your hips and bladder. This is so the pee (urine) does not go back into the tube. 4. Open the pour spout. It is at the bottom of the bag. 5. Empty the  pee into the toilet or pail. Do not let the pour spout touch any surface. 6. Put rubbing alcohol on a gauze pad or cotton ball. 7. Use the gauze pad or cotton ball to clean the pour spout. 8. Close the pour spout. 9. Attach the bag to your leg with tape or a leg strap. 10. Wash your hands with soap and water. Follow instructions for cleaning the drainage bag:  From the product maker.  As told by your doctor. How to change the bag Supplies needed  Alcohol wipes.  A clean bag.  Tape or a leg strap. Changing the bag Replace your bag when it starts to leak, smell bad, or look dirty. 1. Wash your hands with soap and water. 2. Separate the dirty bag from your leg. 3. Pinch the catheter with your fingers so that pee does not spill out. 4. Separate the catheter tube from the bag tube where these tubes connect (at the connection valve). Do not let the tubes touch any surface. 5. Clean the end of the catheter tube with an alcohol wipe. Use a different alcohol wipe to clean the end of the bag tube. 6. Connect the catheter tube to the tube of the clean bag. 7. Attach the clean bag to your leg with tape or a leg strap. Do not make the bag tight on your leg. 8. Wash your hands with soap and water. General rules   Never pull on your catheter. Never try to take it out. Doing that can hurt you.  Always wash your hands before and after you touch your catheter or bag. Use a mild, fragrance-free soap. If you do not have soap and water, use hand sanitizer.  Always make sure there are no twists or bends (kinks) in the catheter tube.  Always make sure there are no leaks in the catheter or bag.  Drink enough fluid to keep your pee pale yellow.  Do not take baths, swim, or use a hot tub.  If you are male, wipe from front to back after you poop (have a bowel movement). Contact a doctor if:  Your pee is cloudy.  Your pee smells worse than usual.  Your catheter gets clogged.  Your catheter  leaks.  Your bladder feels full. Get help right away if:  You have redness, swelling, or pain where the catheter goes into your body.  You have fluid, blood, pus, or a bad smell coming from the area where the catheter goes into your body.  Your skin feels warm where   the catheter goes into your body.  You have a fever.  You have pain in your: ? Belly (abdomen). ? Legs. ? Lower back. ? Bladder.  You see blood in the catheter.  Your pee is pink or red.  You feel sick to your stomach (nauseous).  You throw up (vomit).  You have chills.  Your pee is not draining into the bag.  Your catheter gets pulled out. Summary  An indwelling urinary catheter is a thin tube that is placed into the bladder to help drain pee (urine) out of the body.  The catheter is placed into the part of the body that drains pee from the bladder (urethra).  Taking good care of your catheter will keep it working properly and help prevent problems.  Always wash your hands before and after touching your catheter or bag.  Never pull on your catheter or try to take it out. This information is not intended to replace advice given to you by your health care provider. Make sure you discuss any questions you have with your health care provider. Document Revised: 07/25/2018 Document Reviewed: 11/16/2016 [employer]vier Patient Education  2020 [employer]  

## 2019-09-13 NOTE — Progress Notes (Signed)
Approximately 1345, patient reported he felt "like I did before I started with the clots".  Contacted Dr. Mal Misty and Dr. Jeffie Pollock  - received order for irrgation prn.  Patient medicated for pain and spasms, but unable to successfully irrigate.  Dr. Jeffie Pollock arrived on unit and irrigated the foley and inserted additional 10 cc's to the balloon, but informed the patient he was unable to get him as clear as he did in the ER yesterday.  Dr. Jeffie Pollock to take patient to OR tonight due to the clotting.  Patient is now NPO, and because he ate lunch, will go later this evening.  Patient bathed and given a CHG bath.  Will continue to assess. Patient vebalizes understanding.

## 2019-09-13 NOTE — Anesthesia Postprocedure Evaluation (Signed)
Anesthesia Post Note  Patient: Nicholas Grimes  Procedure(s) Performed: CYSTOSCOPY WITH FULGERATIO (N/A )  Patient location during evaluation: PACU Anesthesia Type: General Level of consciousness: awake and alert Pain management: pain level controlled Vital Signs Assessment: post-procedure vital signs reviewed and stable Respiratory status: spontaneous breathing and respiratory function stable Cardiovascular status: stable Anesthetic complications: no     Last Vitals:  Vitals:   09/13/19 2147 09/13/19 2152  BP:  (!) 153/104  Pulse:  (!) 108  Resp:  12  Temp: (!) 36.1 C   SpO2:  100%    Last Pain:  Vitals:   09/13/19 2147  TempSrc:   PainSc: 0-No pain                 Lillyen Schow K

## 2019-09-13 NOTE — Progress Notes (Addendum)
Progress Note    Nicholas Grimes  [id number] DOB: 1930-12-08  DOA: 09/12/2019 PCP: Baxter Hire, MD      Brief Narrative:    Medical records reviewed and are as summarized below:  Nicholas Grimes is an 86 y.o. male  known history of hypertension, hypothyroidism and osteoarthritis, BPH with urethral stricture status post dilation on 07/16/2019 who presented to the emergency room with acute onset of hematuria that started around 4 AM on the morning of admission.  This was associated with mild dysuria and bladder spasms.  He was recently given a prescription for nitrofurantoin for UTI and positive urine culture.  He was evaluated by the urologist, Dr. Jeffie Pollock, who performed bladder irrigation.      Assessment/Plan:   Active Problems:   Hyponatremia   Hematuria   Hypotension  Gross hematuria, UTI with hemorrhagic cystitis in a patient with BPH urethral stricture s/p dilation on 07/16/2019:  Continue IV Rocephin and Flomax.  Monitor H&H.   Patient has been seen by urologist, Dr. Jeffie Pollock.  68 French Foley was completely obstructed after irrigation so it was replaced with 20 [place] Foley in the emergency room.  76 [place] Foley became obstructed again and Foley was irrigated by urologist.  This Foley catheter was obstructed again on the medical floor so his nurse on the floor obtained an order for irrigation from Dr. Jeffie Pollock. Continue bladder irrigation.  Outpatient follow-up with Dr. Bernardo Heater Tuesday, September 15, 2019.  Hypotension: Discontinue HCTZ.  Treat with IV fluids.  Monitor BP closely  Acute on chronic hyponatremia: Sodium level appears to be close to baseline  Hypokalemia: Improved.  Continue to monitor potassium  Sinus bradycardia: Asymptomatic  Hypothyroidism: Continue Synthroid  Body mass index is 23.73 kg/m.   Family Communication/Anticipated D/C date and plan/Code Status   DVT prophylaxis: SCD Code Status: Full code Family Communication: Plan discussed  with patient Disposition Plan:    Status is: Observation  The patient will require care spanning > 2 midnights and should be moved to inpatient because: IV treatments appropriate due to intensity of illness or inability to take PO and Inpatient level of care appropriate due to severity of illness  Dispo: The patient is from: Home              Anticipated d/c is to: Home              Anticipated d/c date is: 1 day              Patient currently is not medically stable to d/c.           Subjective:   Patient is still having bloody urine with blood clots in the urine bag.  No dizziness, shortness of breath or palpitations.  No abdominal or pelvic pain.  His blood pressure dropped to 79/47.  Objective:    Vitals:   09/13/19 1110 09/13/19 1112 09/13/19 1116 09/13/19 1152  BP: (!) 113/54 (!) 89/52 (!) 79/47 (!) 101/57  Pulse: (!) 54 (!) 57 (!) 101 62  Resp: 17 17    Temp: 98.5 F (36.9 C)   98.6 F (37 C)  TempSrc: Oral   Oral  SpO2: 99% 99% 99% 99%  Weight:      Height:       No data found.   Intake/Output Summary (Last 24 hours) at 09/13/2019 1507 Last data filed at 09/13/2019 0900 Gross per 24 hour  Intake 763.61 ml  Output 900 ml  Net -136.39 ml   Filed Weights   09/12/19 0644  Weight: 79.4 kg    Exam:  GEN: NAD SKIN: No rash EYES: EOMI ENT: MMM CV: RRR PULM: CTA B ABD: soft, ND, NT, +BS CNS: AAO x 3, non focal EXT: No edema or tenderness GU: Foley catheter in place with bloody urine in the urine collection bag   Data Reviewed:   I have personally reviewed following labs and imaging studies:  Labs: Labs show the following:   Basic Metabolic Panel: Recent Labs  Lab 09/12/19 0656 09/13/19 0216  NA 128* 132*  K 3.0* 3.8  CL 91* 99  CO2 26 27  GLUCOSE 161* 114*  BUN 13 15  CREATININE 1.03 0.93  CALCIUM 9.0 8.4*  MG  --  1.9   GFR Estimated Creatinine Clearance: 62.6 mL/min (by C-G formula based on SCr of 0.93 mg/dL). Liver  Function Tests: No results for input(s): AST, ALT, ALKPHOS, BILITOT, PROT, ALBUMIN in the last 168 hours. No results for input(s): LIPASE, AMYLASE in the last 168 hours. No results for input(s): AMMONIA in the last 168 hours. Coagulation profile No results for input(s): INR, PROTIME in the last 168 hours.  CBC: Recent Labs  Lab 09/12/19 0656 09/12/19 2031 09/13/19 0216 09/13/19 0942  WBC 3.7* 6.0 4.4  --   HGB 12.6* 11.0* 11.0* 10.7*  HCT 36.7* 32.2* 31.1* 30.5*  MCV 87.8 88.0 86.1  --   PLT 189 193 187  --    Cardiac Enzymes: No results for input(s): CKTOTAL, CKMB, CKMBINDEX, TROPONINI in the last 168 hours. BNP (last 3 results) No results for input(s): PROBNP in the last 8760 hours. CBG: No results for input(s): GLUCAP in the last 168 hours. D-Dimer: No results for input(s): DDIMER in the last 72 hours. Hgb A1c: No results for input(s): HGBA1C in the last 72 hours. Lipid Profile: No results for input(s): CHOL, HDL, LDLCALC, TRIG, CHOLHDL, LDLDIRECT in the last 72 hours. Thyroid function studies: Recent Labs    09/12/19 0656  TSH 4.060   Anemia work up: No results for input(s): VITAMINB12, FOLATE, FERRITIN, TIBC, IRON, RETICCTPCT in the last 72 hours. Sepsis Labs: Recent Labs  Lab 09/12/19 0656 09/12/19 2031 09/13/19 0216  WBC 3.7* 6.0 4.4    Microbiology Recent Results (from the past 240 hour(s))  Microscopic Examination     Status: Abnormal   Collection Time: 09/08/19 11:50 AM   URINE  Result Value Ref Range Status   WBC, UA 6-10 (A) 0 - 5 /hpf Final   RBC 3-10 (A) 0 - 2 /hpf Final   Epithelial Cells (non renal) 0-10 0 - 10 /hpf Final   Crystals Present (A) N/A Final   Crystal Type Triple Phosphate N/A Final   Bacteria, UA Moderate (A) None seen/Few Final  CULTURE, URINE COMPREHENSIVE     Status: None (Preliminary result)   Collection Time: 09/08/19  1:22 PM   Specimen: Urine   UR  Result Value Ref Range Status   Urine Culture, Comprehensive  Preliminary report  Preliminary   Organism ID, Bacteria Comment  Preliminary    Comment: Microbiological testing to rule out the presence of possible pathogens is in progress. Greater than 100,000 colony forming units per mL   SARS Coronavirus 2 by RT PCR (hospital order, performed in [facility] hospital lab) Nasopharyngeal Nasopharyngeal Swab     Status: None   Collection Time: 09/12/19  8:31 PM   Specimen: Nasopharyngeal Swab  Result Value Ref Range Status  SARS Coronavirus 2 NEGATIVE NEGATIVE Final    Comment: (NOTE) SARS-CoV-2 target nucleic acids are NOT DETECTED. The SARS-CoV-2 RNA is generally detectable in upper and lower respiratory specimens during the acute phase of infection. The lowest concentration of SARS-CoV-2 viral copies this assay can detect is 250 copies / mL. A negative result does not preclude SARS-CoV-2 infection and should not be used as the sole basis for treatment or other patient management decisions.  A negative result may occur with improper specimen collection / handling, submission of specimen other than nasopharyngeal swab, presence of viral mutation(s) within the areas targeted by this assay, and inadequate number of viral copies (<250 copies / mL). A negative result must be combined with clinical observations, patient history, and epidemiological information. Fact Sheet for Patients:   StrictlyIdeas.no Fact Sheet for Healthcare Providers: BankingDealers.co.za This test is not yet approved or cleared  by the [place] FDA and has been authorized for detection and/or diagnosis of SARS-CoV-2 by FDA under an Emergency Use Authorization (EUA).  This EUA will remain in effect (meaning this test can be used) for the duration of the COVID-19 declaration under Section 564(b)(1) of the Act, 21 U.S.C. section 360bbb-3(b)(1), unless the authorization is terminated or revoked sooner. Performed at [facility], [address] [place], [place] [address]     Procedures and diagnostic studies:  No results found.  Medications:   . levothyroxine  75 mcg Oral QAC breakfast  . multivitamin with minerals  1 tablet Oral Q1200  . simvastatin  20 mg Oral q1800  . sodium chloride flush  3 mL Intravenous Once  . tamsulosin  0.8 mg Oral QPC supper   Continuous Infusions: . sodium chloride Stopped (09/13/19 0306)  . cefTRIAXone (ROCEPHIN)  IV 1 g (09/13/19 0306)     LOS: 0 days   Kaitlin Alcindor  [facility]     09/13/2019, 3:07 PM

## 2019-09-13 NOTE — Anesthesia Procedure Notes (Signed)
Procedure Name: LMA Insertion Date/Time: 09/13/2019 9:06 PM Performed by: Allean Found, CRNA Pre-anesthesia Checklist: Patient identified, Patient being monitored, Timeout performed, Emergency Drugs available and Suction available Patient Re-evaluated:Patient Re-evaluated prior to induction Oxygen Delivery Method: Circle system utilized Preoxygenation: Pre-oxygenation with 100% oxygen Induction Type: IV induction Ventilation: Mask ventilation without difficulty LMA: LMA inserted LMA Size: 5.0 Tube type: Oral Number of attempts: 1 Placement Confirmation: positive ETCO2 and breath sounds checked- equal and bilateral Tube secured with: Tape Dental Injury: Teeth and Oropharynx as per pre-operative assessment

## 2019-09-13 NOTE — Op Note (Signed)
Procedure: Cystoscopy with evacuation of clots and fulguration of bleeders.  Preop diagnosis: Clot retention.  Postop diagnosis: Same.  Surgeon: Dr. Irine Seal.  Anesthesia: General.  Specimen: None.  Drain: 36 [place] three-way Foley catheter.  EBL: 50 mL of clot.  Complications: None.  Indications: The patient is an 85 year old white male who I saw in the emergency room yesterday for clot retention.  I was able to get him evacuated free of clots with clear return was stable after placement of the catheter to drainage but he developed some dizziness and hypotension and was admitted to the hospitalist service.  He did well until earlier today when he started having bleeding again with recurrent clot retention.  I saw him on the floor and irrigated him with approximately 2 L of fluid with return of significant amount of clot but I could not get in clear so it was felt that a trip to the operating room was indicated for further clot evacuation and fulguration of any identifiable bleeders.  Procedure: He been on Rocephin.  He was taken operating room where general anesthetic was induced.  He was placed in lithotomy position.  His genitalia and perineum were prepped with Betadine scrub and he was draped in usual sterile fashion.  Cystoscopy was performed using a 23 [place] scope and 30 degree lens.  Examination revealed a normal urethra without evidence of recurrent stricture.  The external sphincter was intact.  The prostatic urethra had a well resected TUR defect with some regrowth at the left apex and right proximal prostatic urethra.  Examination of bladder revealed a moderate amount of clot which was evacuated and approximately 50 cc was retrieved.  Inspection of bladder wall then demonstrated severe trabeculation with multiple cellules.  The mucosa was was erythematous particularly on the posterior wall from his recent catheterization.  Ureteral orifices were unremarkable.  Initially I was  unable to identify clear source of bleeding but as the scope was pulled back into the prostatic urethra an area at the left apex with evidence of regrowth had a brisk bleeder.  The Bugbee electrode was then used to fulgurate the bleeder at the apex and as is commonly the case is one bleeder was fulgurated another would start so an area of approximately 1-1/2 to 2 cm was eventually fulgurated to eliminate the bleeding.  Final inspection revealed no active bleeding and no further retained clots.  The cystoscope was removed and a 22 [place] three-way Foley catheter was inserted.  The catheter balloon was filled with 30 mL of fluid and the catheter was placed to continuous irrigation and straight drainage.  The irrigant returned very light pink and then proceeded to clear.  He was taken down from lithotomy position, his anesthetic was reversed and he was moved recovery in stable condition.  There were no complications.

## 2019-09-13 NOTE — Transfer of Care (Signed)
Immediate Anesthesia Transfer of Care Note  Patient: Nicholas Grimes  Procedure(s) Performed: CYSTOSCOPY WITH FULGERATIO (N/A )  Patient Location: PACU  Anesthesia Type:General  Level of Consciousness: awake and sedated  Airway & Oxygen Therapy: Patient Spontanous Breathing and Patient connected to face mask oxygen  Post-op Assessment: Report given to RN and Post -op Vital signs reviewed and stable  Post vital signs: Reviewed and stable  Last Vitals:  Vitals Value Taken Time  BP 153/104 09/13/19 2152  Temp 36.1 C 09/13/19 2147  Pulse 111 09/13/19 2159  Resp 11 09/13/19 2159  SpO2 99 % 09/13/19 2159  Vitals shown include unvalidated device data.  Last Pain:  Vitals:   09/13/19 2147  TempSrc:   PainSc: 0-No pain      Patients Stated Pain Goal: 2 ([id number])  Complications: No apparent anesthesia complications

## 2019-09-13 NOTE — Anesthesia Preprocedure Evaluation (Signed)
Anesthesia Evaluation  Patient identified by MRN, date of birth, ID band Patient awake    Reviewed: Allergy & Precautions, NPO status , Patient's Chart, lab work & pertinent test results  History of Anesthesia Complications Negative for: history of anesthetic complications  Airway Mallampati: II       Dental   Pulmonary neg sleep apnea, neg COPD, Not current smoker, former smoker,           Cardiovascular hypertension, Pt. on medications (-) Past MI and (-) CHF (-) dysrhythmias (-) Valvular Problems/Murmurs     Neuro/Psych neg Seizures    GI/Hepatic Neg liver ROS, neg GERD  ,  Endo/Other  Hypothyroidism   Renal/GU Renal InsufficiencyRenal disease     Musculoskeletal   Abdominal   Peds  Hematology  (+) anemia ,   Anesthesia Other Findings   Reproductive/Obstetrics                             Anesthesia Physical Anesthesia Plan  ASA: III and emergent  Anesthesia Plan: General   Post-op Pain Management:    Induction: Intravenous  PONV Risk Score and Plan: 2 and Ondansetron and Dexamethasone  Airway Management Planned: LMA  Additional Equipment:   Intra-op Plan:   Post-operative Plan:   Informed Consent: I have reviewed the patients History and Physical, chart, labs and discussed the procedure including the risks, benefits and alternatives for the proposed anesthesia with the patient or authorized representative who has indicated his/her understanding and acceptance.       Plan Discussed with:   Anesthesia Plan Comments:         Anesthesia Quick Evaluation

## 2019-09-13 NOTE — Progress Notes (Signed)
Subjective: CC: Clot retention.  I saw Nicholas Grimes yesterday in the ER for clot retention and was able to get him completely clear.  He was doing well until earlier today when he began to have recurrent bladder spasms and leaking around the foley.  The nurse attempted irrigation without success.  His hgb is down to 10.2 from 11 yesterday.  ROS:  Review of Systems  Gastrointestinal: Positive for abdominal pain.  All other systems reviewed and are negative.   Anti-infectives: Anti-infectives (From admission, onward)   Start     Dose/Rate Route Frequency Ordered Stop   09/12/19 2315  cefTRIAXone (ROCEPHIN) 1 g in sodium chloride 0.9 % 100 mL IVPB     1 g 200 mL/hr over 30 Minutes Intravenous Every 24 hours 09/12/19 2306     09/12/19 0000  ciprofloxacin (CIPRO) 500 MG tablet  Status:  Discontinued     500 mg Oral 2 times daily 09/12/19 1512 09/12/19       Current Facility-Administered Medications  Medication Dose Route Frequency Provider Last Rate Last Admin  . 0.9 %  sodium chloride infusion   Intravenous Continuous Mansy, Arvella Merles, MD   Stopped at 09/13/19 [phone]  . acetaminophen (TYLENOL) tablet 650 mg  650 mg Oral Q6H PRN Mansy, Jan A, MD       Or  . acetaminophen (TYLENOL) suppository 650 mg  650 mg Rectal Q6H PRN Mansy, Jan A, MD      . cefTRIAXone (ROCEPHIN) 1 g in sodium chloride 0.9 % 100 mL IVPB  1 g Intravenous Q24H Mansy, Jan A, MD 200 mL/hr at 09/13/19 0306 1 g at 09/13/19 0306  . levothyroxine (SYNTHROID) tablet 75 mcg  75 mcg Oral QAC breakfast Mansy, Jan A, MD      . magnesium hydroxide (MILK OF MAGNESIA) suspension 30 mL  30 mL Oral Daily PRN Mansy, Jan A, MD      . multivitamin with minerals tablet 1 tablet  1 tablet Oral Q1200 Mansy, Jan A, MD   1 tablet at 09/13/19 1334  . ondansetron (ZOFRAN) tablet 4 mg  4 mg Oral Q6H PRN Mansy, Jan A, MD       Or  . ondansetron [facility]) injection 4 mg  4 mg Intravenous Q6H PRN Mansy, Jan A, MD      . oxybutynin (DITROPAN)  tablet 5 mg  5 mg Oral PRN Mansy, Jan A, MD   5 mg at 09/13/19 1433  . simvastatin (ZOCOR) tablet 20 mg  20 mg Oral q1800 Mansy, Jan A, MD   20 mg at 09/13/19 0303  . sodium chloride flush (NS) 0.9 % injection 3 mL  3 mL Intravenous Once Lavonia Drafts, MD      . tamsulosin [facility]) capsule 0.8 mg  0.8 mg Oral QPC supper Mansy, Jan A, MD      . traMADol Veatrice Bourbon) tablet 50 mg  50 mg Oral Q6H PRN Mansy, Jan A, MD   50 mg at 09/13/19 1349  . traZODone (DESYREL) tablet 25 mg  25 mg Oral QHS PRN Mansy, Arvella Merles, MD         Objective: Vital signs in last 24 hours: Temp:  [97.5 F (36.4 C)-98.6 F (37 C)] 98.6 F (37 C) (05/30 1152) Pulse Rate:  [51-101] 62 (05/30 1152) Resp:  [16-18] 17 (05/30 1112) BP: (79-154)/(47-74) 101/57 (05/30 1152) SpO2:  [95 %-100 %] 99 % (05/30 1152)  Intake/Output from previous day: 05/29 0701 - 05/30 0700 In: 643.6 [I.V.:143.6; IV  [id number] Out: 900 [Urine:900] Intake/Output this shift: Total I/O In: 120 [P.O.:120] Out: -    Physical Exam  Lab Results:  Recent Labs    09/12/19 2031 09/12/19 2031 09/13/19 0216 09/13/19 0216 09/13/19 0942 09/13/19 1534  WBC 6.0  --  4.4  --   --   --   HGB 11.0*   < > 11.0*   < > 10.7* 10.2*  HCT 32.2*   < > 31.1*   < > 30.5* 28.9*  PLT 193  --  187  --   --   --    < > = values in this interval not displayed.   BMET Recent Labs    09/12/19 0656 09/13/19 0216  NA 128* 132*  K 3.0* 3.8  CL 91* 99  CO2 26 27  GLUCOSE 161* 114*  BUN 13 15  CREATININE 1.03 0.93  CALCIUM 9.0 8.4*   PT/INR No results for input(s): LABPROT, INR in the last 72 hours. ABG No results for input(s): PHART, HCO3 in the last 72 hours.  Invalid input(s): PCO2, PO2  Studies/Results:  I spent approximately 15-51min at the bedside irrigating the bladder with return of 129ml or more of clot, but was unable to clear the urine.    Assessment and Plan: Recurrent gross hematuria with clot retention.   I was unable to clear him  despite about 2l of irrigation and he appears to have active bleeding.   He ate at 1pm so I will take him to the OR about 8pm.  He was consented for cystoscopy with clot evacuation and fulguration of bleeders.       LOS: 0 days    Nicholas Grimes 09/13/2019 [id number] ID: Nicholas Grimes, male   DOB: 1930-04-27, 86 y.o.   MRN: [id number]

## 2019-09-14 DIAGNOSIS — R310 Gross hematuria: Secondary | ICD-10-CM

## 2019-09-14 DIAGNOSIS — R338 Other retention of urine: Secondary | ICD-10-CM

## 2019-09-14 DIAGNOSIS — R5381 Other malaise: Secondary | ICD-10-CM

## 2019-09-14 DIAGNOSIS — N3289 Other specified disorders of bladder: Secondary | ICD-10-CM

## 2019-09-14 LAB — BASIC METABOLIC PANEL
Anion gap: 5 (ref 5–15)
BUN: 14 mg/dL (ref 8–23)
CO2: 26 mmol/L (ref 22–32)
Calcium: 8.5 mg/dL — ABNORMAL LOW (ref 8.9–10.3)
Chloride: 101 mmol/L (ref 98–111)
Creatinine, Ser: 0.97 mg/dL (ref 0.61–1.24)
GFR calc Af Amer: >60 mL/min (ref >60)
GFR calc non Af Amer: >60 mL/min (ref >60)
Glucose, Bld: 110 mg/dL — ABNORMAL HIGH (ref 70–99)
Potassium: 4.3 mmol/L (ref 3.5–5.1)
Sodium: 132 mmol/L — ABNORMAL LOW (ref 135–145)

## 2019-09-14 LAB — CBC WITH DIFFERENTIAL/PLATELET
Abs Immature Granulocytes: 0.02 10*3/uL (ref 0.00–0.07)
Basophils Absolute: 0.1 10*3/uL (ref 0.0–0.1)
Basophils Relative: 1 %
Eosinophils Absolute: 0.2 10*3/uL (ref 0.0–0.5)
Eosinophils Relative: 4 %
HCT: 26.4 % — ABNORMAL LOW (ref 39.0–52.0)
Hemoglobin: 8.8 g/dL — ABNORMAL LOW (ref 13.0–17.0)
Immature Granulocytes: 0 %
Lymphocytes Relative: 15 %
Lymphs Abs: 0.8 10*3/uL (ref 0.7–4.0)
MCH: 30 pg (ref 26.0–34.0)
MCHC: 33.3 g/dL (ref 30.0–36.0)
MCV: 90.1 fL (ref 80.0–100.0)
Monocytes Absolute: 0.4 10*3/uL (ref 0.1–1.0)
Monocytes Relative: 7 %
Neutro Abs: 4 10*3/uL (ref 1.7–7.7)
Neutrophils Relative %: 73 %
Platelets: 193 10*3/uL (ref 150–400)
RBC: 2.93 MIL/uL — ABNORMAL LOW (ref 4.22–5.81)
RDW: 13.7 % (ref 11.5–15.5)
WBC: 5.4 10*3/uL (ref 4.0–10.5)
nRBC: 0 % (ref 0.0–0.2)

## 2019-09-14 MED ORDER — FINASTERIDE 5 MG PO TABS: 5.0000 mg | ORAL_TABLET | Freq: Every day | ORAL | 0 refills | Status: DC

## 2019-09-14 NOTE — Progress Notes (Signed)
Pt d/c home with all pt belongings.  Discharge summary was reviewed with pt and he expressed understanding.  CBI was d/c'd and port was plugged, leg bag was applied per pts request.  Follow up urology appointment was unable to be made d/t offices closed for [facility] Day.  Pt was instructed to call Uro office Tuesday to schedule to have foley removed he expressed understanding.  Pt was wheeled down to med mall where he was assisted into car by NT and a friend is driving him home.  IV removed without issue.

## 2019-09-14 NOTE — Discharge Summary (Signed)
Physician Discharge Summary  Nicholas Grimes [id number] DOB: 05-26-1930 DOA: 09/12/2019  PCP: Baxter Hire, MD  Admit date: 09/12/2019 Discharge date: 09/14/2019  Discharge disposition: Home with home health therapy   Recommendations for Outpatient Follow-Up:   Outpatient follow-up with urologist, Dr. Bernardo Heater on Wednesday, 09/15/2019 Outpatient follow-up with PCP in 1 week   Discharge Diagnosis:   Active Problems:   Hyponatremia   Hematuria   Hypotension    Discharge Condition: Stable.  Diet recommendation: Regular diet  Code status: Full code.    Hospital Course:   Nicholas Grimes is an 86 y.o. male known history of hypertension, hypothyroidism and osteoarthritis, BPH with urethral stricture status post dilation on 07/16/2019 who presented to the emergency room with acute onset of hematuriathat started around 4 AM on the morning of admission.  This was associated with mild dysuria and bladder spasms. He was recently given a prescription for nitrofurantoin for UTI and positive urine culture.    He was given IV fluids for hypotension and acute on chronic hyponatremia.  He was also treated with IV Rocephin for UTI/hemorrhagic cystitis.  He was evaluated by the urologist, Dr. Jeffie Pollock, who performed bladder irrigation.  Unfortunately, he continued to have hematuria with blood clots so he was taken to operating room on 09/13/2019 for cystoscopy with evacuation of clots and fulguration of bleeders.  Hematuria has resolved and his condition has improved.  He is deemed stable for discharge to home.      Discharge Exam:   Vitals:   09/14/19 0844 09/14/19 0847  BP: 134/85 113/68  Pulse:    Resp:    Temp:    SpO2:     Vitals:   09/13/19 2302 09/14/19 0841 09/14/19 0844 09/14/19 0847  BP: (!) 145/94 (!) 163/72 134/85 113/68  Pulse: 89 60    Resp: 17 16    Temp: 97.6 F (36.4 C) 98.5 F (36.9 C)    TempSrc:      SpO2: 100% 100%    Weight:      Height:          GEN: NAD SKIN: No rash EYES: No pallor or icterus ENT: MMM CV: RRR PULM: CTA B ABD: soft, ND, NT, +BS CNS: AAO x 3, non focal EXT: No edema or tenderness GU: Foley catheter draining amber urine   The results of significant diagnostics from this hospitalization (including imaging, microbiology, ancillary and laboratory) are listed below for reference.     Procedures and Diagnostic Studies:   DG OR UROLOGY CYSTO IMAGE ([facility] ONLY)  Result Date: 09/13/2019 There is no interpretation for this exam.  This order is for images obtained during a surgical procedure.  Please See "Surgeries" Tab for more information regarding the procedure.     Labs:   Basic Metabolic Panel: Recent Labs  Lab 09/12/19 0656 09/12/19 0656 09/13/19 0216 09/14/19 0508  NA 128*  --  132* 132*  K 3.0*   < > 3.8 4.3  CL 91*  --  99 101  CO2 26  --  27 26  GLUCOSE 161*  --  114* 110*  BUN 13  --  15 14  CREATININE 1.03  --  0.93 0.97  CALCIUM 9.0  --  8.4* 8.5*  MG  --   --  1.9  --    < > = values in this interval not displayed.   GFR Estimated Creatinine Clearance: 60 mL/min (by C-G formula based on SCr of 0.97 mg/dL).  Liver Function Tests: No results for input(s): AST, ALT, ALKPHOS, BILITOT, PROT, ALBUMIN in the last 168 hours. No results for input(s): LIPASE, AMYLASE in the last 168 hours. No results for input(s): AMMONIA in the last 168 hours. Coagulation profile No results for input(s): INR, PROTIME in the last 168 hours.  CBC: Recent Labs  Lab 09/12/19 0656 09/12/19 0656 09/12/19 2031 09/13/19 0216 09/13/19 0942 09/13/19 1534 09/14/19 0508  WBC 3.7*  --  6.0 4.4  --   --  5.4  NEUTROABS  --   --   --   --   --   --  4.0  HGB 12.6*   < > 11.0* 11.0* 10.7* 10.2* 8.8*  HCT 36.7*   < > 32.2* 31.1* 30.5* 28.9* 26.4*  MCV 87.8  --  88.0 86.1  --   --  90.1  PLT 189  --  193 187  --   --  193   < > = values in this interval not displayed.   Cardiac Enzymes: No results for  input(s): CKTOTAL, CKMB, CKMBINDEX, TROPONINI in the last 168 hours. BNP: Invalid input(s): POCBNP CBG: No results for input(s): GLUCAP in the last 168 hours. D-Dimer No results for input(s): DDIMER in the last 72 hours. Hgb A1c No results for input(s): HGBA1C in the last 72 hours. Lipid Profile No results for input(s): CHOL, HDL, LDLCALC, TRIG, CHOLHDL, LDLDIRECT in the last 72 hours. Thyroid function studies Recent Labs    09/12/19 0656  TSH 4.060   Anemia work up No results for input(s): VITAMINB12, FOLATE, FERRITIN, TIBC, IRON, RETICCTPCT in the last 72 hours. Microbiology Recent Results (from the past 240 hour(s))  Microscopic Examination     Status: Abnormal   Collection Time: 09/08/19 11:50 AM   URINE  Result Value Ref Range Status   WBC, UA 6-10 (A) 0 - 5 /hpf Final   RBC 3-10 (A) 0 - 2 /hpf Final   Epithelial Cells (non renal) 0-10 0 - 10 /hpf Final   Crystals Present (A) N/A Final   Crystal Type Triple Phosphate N/A Final   Bacteria, UA Moderate (A) None seen/Few Final  CULTURE, URINE COMPREHENSIVE     Status: None (Preliminary result)   Collection Time: 09/08/19  1:22 PM   Specimen: Urine   UR  Result Value Ref Range Status   Urine Culture, Comprehensive Preliminary report  Preliminary   Organism ID, Bacteria Comment  Preliminary    Comment: Microbiological testing to rule out the presence of possible pathogens is in progress. Greater than 100,000 colony forming units per mL   SARS Coronavirus 2 by RT PCR (hospital order, performed in [facility] hospital lab) Nasopharyngeal Nasopharyngeal Swab     Status: None   Collection Time: 09/12/19  8:31 PM   Specimen: Nasopharyngeal Swab  Result Value Ref Range Status   SARS Coronavirus 2 NEGATIVE NEGATIVE Final    Comment: (NOTE) SARS-CoV-2 target nucleic acids are NOT DETECTED. The SARS-CoV-2 RNA is generally detectable in upper and lower respiratory specimens during the acute phase of infection. The  lowest concentration of SARS-CoV-2 viral copies this assay can detect is 250 copies / mL. A negative result does not preclude SARS-CoV-2 infection and should not be used as the sole basis for treatment or other patient management decisions.  A negative result may occur with improper specimen collection / handling, submission of specimen other than nasopharyngeal swab, presence of viral mutation(s) within the areas targeted by this assay, and inadequate number  of viral copies (<250 copies / mL). A negative result must be combined with clinical observations, patient history, and epidemiological information. Fact Sheet for Patients:   StrictlyIdeas.no Fact Sheet for Healthcare Providers: BankingDealers.co.za This test is not yet approved or cleared  by the [place] FDA and has been authorized for detection and/or diagnosis of SARS-CoV-2 by FDA under an Emergency Use Authorization (EUA).  This EUA will remain in effect (meaning this test can be used) for the duration of the COVID-19 declaration under Section 564(b)(1) of the Act, 21 U.S.C. section 360bbb-3(b)(1), unless the authorization is terminated or revoked sooner. Performed at [facility], [address], [place], [place] [address]      Discharge Instructions:   Discharge Instructions    Diet - low sodium heart healthy   Complete by: As directed    Increase activity slowly   Complete by: As directed      Allergies as of 09/14/2019   No Known Allergies     Medication List    TAKE these medications   finasteride 5 MG tablet Commonly known as: PROSCAR Take 1 tablet (5 mg total) by mouth daily. Start taking on: September 15, 2019   hydrochlorothiazide 25 MG tablet Commonly known as: HYDRODIURIL Take 25 mg by mouth daily.   levothyroxine 75 MCG tablet Commonly known as: SYNTHROID Take 75 mcg by mouth daily before breakfast.   multivitamin with minerals Tabs  tablet Take 1 tablet by mouth daily at 12 noon. Cemtrum Silver   nitrofurantoin (macrocrystal-monohydrate) 100 MG capsule Commonly known as: MACROBID Take 1 capsule (100 mg total) by mouth every 12 (twelve) hours for 10 days.   oxybutynin 5 MG tablet Commonly known as: DITROPAN Take 1 tablet (5 mg total) by mouth as needed for bladder spasms.   simvastatin 20 MG tablet Commonly known as: ZOCOR Take 20 mg by mouth at bedtime.   tamsulosin 0.4 MG Caps capsule Commonly known as: FLOMAX Take 2 capsules (0.8 mg total) by mouth daily after supper.   traMADol 50 MG tablet Commonly known as: ULTRAM Take 1 tablet (50 mg total) by mouth every 6 (six) hours as needed for moderate pain.      Follow-up Information    Abbie Sons, MD. Schedule an appointment as soon as possible for a visit on 09/15/2019.   Specialty: Urology Why: For ER visit follow-up and reevaluation. Contact information: [place] [id number] [phone]            Time coordinating discharge: 28 minutes  Signed:  Jennye Boroughs  [facility] 09/14/2019, 11:32 AM

## 2019-09-14 NOTE — TOC Progression Note (Signed)
Transition of Care [facility]) - Progression Note    Patient Details  Name: Nicholas Grimes MRN: [id number] Date of Birth: 1930/12/30  [facility]) CM/SW Contact  Su Hilt, RN Phone Number: 09/14/2019, 1:36 PM  Clinical Narrative:     [name] the patientt's niece called asking about Home health, I called Santiago Glad with [facility] and set the patient up with Home health PT       Expected Discharge Plan and Services           Expected Discharge Date: 09/14/19                                     Social Determinants of Health (SDOH) Interventions    Readmission Risk Interventions No flowsheet data found.

## 2019-09-14 NOTE — TOC Transition Note (Signed)
Transition of Care [facility]) - CM/SW Discharge Note   Patient Details  Name: Nicholas Grimes MRN: [id number] Date of Birth: [birth year]  Transition of Care [facility]) CM/SW Contact:  Su Hilt, RN Phone Number: 09/14/2019, 12:04 PM   Clinical Narrative:    Met with the patient to discuss DC plan and needs, He lives home alone, his spouse passed away recently, his neighbors help him a lot and provide transportation and anything else he needs, he is ambulatory without assistance and stated he has no DME needs         Patient Goals and CMS Choice        Discharge Placement                       Discharge Plan and Services                                     Social Determinants of Health (SDOH) Interventions     Readmission Risk Interventions No flowsheet data found.

## 2019-09-14 NOTE — Progress Notes (Signed)
1 Day Post-Op Subjective: Patient reports no complaints.  CBI clamped and urine clear.  Objective: Vital signs in last 24 hours: Temp:  [97 F (36.1 C)-98.5 F (36.9 C)] 98.5 F (36.9 C) (05/31 0841) Pulse Rate:  [59-111] 60 (05/31 0841) Resp:  [11-17] 16 (05/31 0841) BP: (113-163)/(68-104) 113/68 (05/31 0847) SpO2:  [98 %-100 %] 100 % (05/31 0841)  Intake/Output from previous day: 05/30 0701 - 05/31 0700 In: 14989.8 [P.O.:120; I.V.:2370.7; IV Piggyback:199.1] Out: 15800 [Urine:15800] Intake/Output this shift: Total I/O In: 240 [P.O.:240] Out: 250 [Urine:250]  Physical Exam:  Watching TV in bed Alert and oriented x3 Three-way Foley catheter in place with clear urine, CBI clamped.  Lab Results: Recent Labs    09/13/19 0942 09/13/19 1534 09/14/19 0508  HGB 10.7* 10.2* 8.8*  HCT 30.5* 28.9* 26.4*   BMET Recent Labs    09/13/19 0216 09/14/19 0508  NA 132* 132*  K 3.8 4.3  CL 99 101  CO2 27 26  GLUCOSE 114* 110*  BUN 15 14  CREATININE 0.93 0.97  CALCIUM 8.4* 8.5*   No results for input(s): LABPT, INR in the last 72 hours. No results for input(s): LABURIN in the last 72 hours. Results for orders placed or performed during the hospital encounter of 09/12/19  SARS Coronavirus 2 by RT PCR (hospital order, performed in [facility] hospital lab) Nasopharyngeal Nasopharyngeal Swab     Status: None   Collection Time: 09/12/19  8:31 PM   Specimen: Nasopharyngeal Swab  Result Value Ref Range Status   SARS Coronavirus 2 NEGATIVE NEGATIVE Final    Comment: (NOTE) SARS-CoV-2 target nucleic acids are NOT DETECTED. The SARS-CoV-2 RNA is generally detectable in upper and lower respiratory specimens during the acute phase of infection. The lowest concentration of SARS-CoV-2 viral copies this assay can detect is 250 copies / mL. A negative result does not preclude SARS-CoV-2 infection and should not be used as the sole basis for treatment or other patient management  decisions.  A negative result may occur with improper specimen collection / handling, submission of specimen other than nasopharyngeal swab, presence of viral mutation(s) within the areas targeted by this assay, and inadequate number of viral copies (<250 copies / mL). A negative result must be combined with clinical observations, patient history, and epidemiological information. Fact Sheet for Patients:   StrictlyIdeas.no Fact Sheet for Healthcare Providers: BankingDealers.co.za This test is not yet approved or cleared  by the [place] FDA and has been authorized for detection and/or diagnosis of SARS-CoV-2 by FDA under an Emergency Use Authorization (EUA).  This EUA will remain in effect (meaning this test can be used) for the duration of the COVID-19 declaration under Section 564(b)(1) of the Act, 21 U.S.C. section 360bbb-3(b)(1), unless the authorization is terminated or revoked sooner. Performed at [facility], [address] [place], [place] [address]     Studies/Results: DG OR UROLOGY CYSTO IMAGE [facility] ONLY)  Result Date: 09/13/2019 There is no interpretation for this exam.  This order is for images obtained during a surgical procedure.  Please See "Surgeries" Tab for more information regarding the procedure.    Assessment/Plan: BPH-continue finasteride and tamsulosin at discharge.  Gross hematuria-likely from prostatic urethral bleeding status post fulguration.  Urine clear today.  Continue Foley catheter at discharge for outpatient voiding trial.  I sent a message to office to arrange follow-up this week for voiding trial.  We will sign off.  Patient stable from GU point of view for discharge.  Thank you.    LOS: 1 day   Festus Aloe 09/14/2019, 12:01 PM

## 2019-09-15 ENCOUNTER — Telehealth: Payer: Self-pay | Admitting: Urology

## 2019-09-15 LAB — CULTURE, URINE COMPREHENSIVE

## 2019-09-15 NOTE — Telephone Encounter (Signed)
-----   Message from Festus Aloe, MD sent at 09/14/2019 12:04 PM EDT ----- Needs to see [facility] or [place] Wed or Thursday AM for void trial/foley removal. Thanks.

## 2019-09-15 NOTE — Telephone Encounter (Signed)
App made and pt is aware °

## 2019-09-16 ENCOUNTER — Ambulatory Visit: Payer: Medicare Other | Admitting: Physician Assistant

## 2019-09-16 ENCOUNTER — Encounter: Payer: Self-pay | Admitting: Physician Assistant

## 2019-09-16 ENCOUNTER — Other Ambulatory Visit: Payer: Self-pay | Admitting: Urology

## 2019-09-16 ENCOUNTER — Other Ambulatory Visit: Payer: Self-pay

## 2019-09-16 VITALS — BP 139/74 | HR 84 | Ht 72.0 in | Wt 175.0 lb

## 2019-09-16 DIAGNOSIS — R310 Gross hematuria: Secondary | ICD-10-CM | POA: Diagnosis not present

## 2019-09-16 LAB — BLADDER SCAN AMB NON-IMAGING

## 2019-09-16 MED ORDER — DOXYCYCLINE HYCLATE 100 MG PO CAPS: 100.0000 mg | ORAL_CAPSULE | Freq: Two times a day (BID) | ORAL | 0 refills | Status: AC

## 2019-09-16 NOTE — Progress Notes (Signed)
Afternoon follow-up   Patient returned to clinic this afternoon for repeat PVR.  He reports drinking approximately 50 ounces of fluid during the day.  He has voided twice.  PVR 231 mL.  Results for orders placed or performed in visit on 09/16/19  BLADDER SCAN AMB NON-IMAGING  Result Value Ref Range   Scan Result 273ml     Offered patient repeat PVR tomorrow morning versus self-catheterization versus Foley replacement today.  Patient elects for repeat PVR tomorrow morning.  I explained that if his PVR is increased tomorrow, I will recommend Foley catheter replacement for management of chronic retention.  Additionally, I strongly encouraged him to consider suprapubic catheterization to bypass his urethral stricture and also reduce the risk of prostatic bleeding in the future.  Counseled patient to proceed to the emergency department overnight if he develops lower abdominal pain and the inability to urinate.  He expressed understanding.  Additionally, I explained that I would like to change the patient's antibiotics based on his recent urine culture results.  He has been taking Macrobid.  I have initiated sensitivity testing on his corynebacterium urine culture, but would like to switch him to doxycycline empirically in the interim.  Counseled him to stop Macrobid and start doxycycline today.  Follow-up: Return in about 1 day (around 09/17/2019) for Repeat PVR.

## 2019-09-16 NOTE — Progress Notes (Signed)
Fill and Pull Catheter Removal  Patient is present today for a catheter removal.  Patient was cleaned and prepped in a sterile fashion 147ml of sterile saline was instilled into the bladder when the patient felt the urge to urinate. 69ml of water was then drained from the balloon.  A 22FR three-way foley cath was removed from the bladder; patient had an immediate bladder spasm with efflux of an unmeasured volume of fluid.  Patient was then given some time to void on their own.  Patient can void 160ml on their own after some time.  Patient tolerated well.  Performed by: Debroah Loop, PA-C   Follow up/ Additional notes: Push fluid and RTC this afternoon for PVR.

## 2019-09-16 NOTE — Patient Instructions (Signed)
1. Stop your Macrobid and go pick up Doxycycline from your pharmacy. Start taking this today. 2. I will see you back in clinic tomorrow to scan your bladder again. If you are have increased lower abdominal pain overnight, go to the Emergency Room. 3. If you end up needing a catheter tomorrow, I strongly recommend that you consider getting a suprapubic catheter. This is a catheter that enters your bladder through your lower belly. This would be an excellent choice for you because it would allow [place] to drain your bladder without having to worry about your stricture coming back and it would decrease the risk of more bleeding.

## 2019-09-17 ENCOUNTER — Ambulatory Visit (INDEPENDENT_AMBULATORY_CARE_PROVIDER_SITE_OTHER): Payer: Medicare Other | Admitting: Physician Assistant

## 2019-09-17 ENCOUNTER — Telehealth: Payer: Self-pay

## 2019-09-17 ENCOUNTER — Other Ambulatory Visit: Payer: Self-pay | Admitting: Radiology

## 2019-09-17 VITALS — BP 179/66 | HR 76 | Ht 71.0 in | Wt 175.0 lb

## 2019-09-17 DIAGNOSIS — R339 Retention of urine, unspecified: Secondary | ICD-10-CM | POA: Diagnosis not present

## 2019-09-17 DIAGNOSIS — N472 Paraphimosis: Secondary | ICD-10-CM

## 2019-09-17 LAB — BLADDER SCAN AMB NON-IMAGING: Scan Result: 311

## 2019-09-17 NOTE — Patient Instructions (Addendum)
I will arrange for suprapubic catheter placement with the Interventional Radiology team. As we discussed today, this is a catheter that enters through your lower abdomen to drain your bladder. This would bypass the stricture in your urethra and also reduce the risk of bleeding from your prostate.  Interventional Radiology will manage your suprapubic catheter for the first several months, then they will send you back to our clinic for monthly catheter exchanges with me.  Additionally, PLEASE make sure that your foreskin stays down covering the head of your penis. If it gets pushed back behind the head of your penis and you are not able to put it back in place, call our office immediately. This is an emergency.

## 2019-09-17 NOTE — Telephone Encounter (Signed)
Patient called stating that he is having leakage around his catheter that was placed this morning. He states urine is draining into the bag but some is coming around the cath at the tip of the penis. He was reassured that this was bladder spasms and should get better with time. If the urine stops draining into bag he is to call back other wise keep follow up as scheduled

## 2019-09-17 NOTE — Progress Notes (Signed)
09/17/2019 11:43 AM   Nicholas Grimes December 04, 1930 [id number]  CC: Chief Complaint  Patient presents with  . Urinary Retention    HPI: Nicholas Grimes is a 86 y.o. male with PMH urethral stricture disease s/p urethral dilation on 07/16/2019 who stopped CIC at home secondary to gross hematuria and who subsequently developed urinary retention requiring Foley catheterization and who then developed clot retention requiring cystoscopy with clot evacuation and fulguration with Dr. Jeffie Pollock on 09/13/2019.  He completed postop voiding trial in clinic yesterday with elevated PVR of 231 mL and elected to return to clinic today for repeat PVR.  Patient reports having urinated overnight.  He denies lower abdominal pain, urgency, or frequency.  PVR 311 mL.  PMH: Past Medical History:  Diagnosis Date  . Arthritis   . Cancer ([place])    Basal Cell Skin Cancer  . Hypertension   . Hypothyroidism   . Thyroid disease     Surgical History: Past Surgical History:  Procedure Laterality Date  . CHOLECYSTECTOMY    . CYSTOSCOPY WITH FULGERATION N/A 09/13/2019   Procedure: CYSTOSCOPY WITH FULGERATIO;  Surgeon: Irine Seal, MD;  Location: [facility] ORS;  Service: Urology;  Laterality: N/A;  . CYSTOSCOPY WITH URETHRAL DILATATION N/A 07/16/2019   Procedure: CYSTOSCOPY WITH URETHRAL DILATATION;  Surgeon: Abbie Sons, MD;  Location: [facility] ORS;  Service: Urology;  Laterality: N/A;  . INGUINAL HERNIA REPAIR Right 10/30/2016   Procedure: HERNIA REPAIR INGUINAL ADULT;  Surgeon: Leonie Green, MD;  Location: [facility] ORS;  Service: General;  Laterality: Right;  . INSERTION OF MESH  10/30/2016   Procedure: INSERTION OF MESH;  Surgeon: Leonie Green, MD;  Location: [facility] ORS;  Service: General;;  . PROSTATE SURGERY      Home Medications:  Allergies as of 09/17/2019   No Known Allergies     Medication List       Accurate as of September 17, 2019 11:43 AM. If you have any questions, ask your nurse or doctor.        ciprofloxacin 500 MG tablet Commonly known as: CIPRO Take 500 mg by mouth 2 (two) times daily.   doxycycline 100 MG capsule Commonly known as: VIBRAMYCIN Take 1 capsule (100 mg total) by mouth 2 (two) times daily for 7 days.   doxycycline 100 MG tablet Commonly known as: VIBRA-TABS Take 100 mg by mouth 2 (two) times daily.   finasteride 5 MG tablet Commonly known as: PROSCAR Take 1 tablet (5 mg total) by mouth daily.   hydrochlorothiazide 25 MG tablet Commonly known as: HYDRODIURIL Take 25 mg by mouth daily.   levothyroxine 75 MCG tablet Commonly known as: SYNTHROID Take 75 mcg by mouth daily before breakfast.   multivitamin with minerals Tabs tablet Take 1 tablet by mouth daily at 12 noon. Cemtrum Silver   nitrofurantoin (macrocrystal-monohydrate) 100 MG capsule Commonly known as: MACROBID Take 1 capsule (100 mg total) by mouth every 12 (twelve) hours for 10 days.   oxybutynin 5 MG tablet Commonly known as: DITROPAN Take 1 tablet (5 mg total) by mouth as needed for bladder spasms.   simvastatin 20 MG tablet Commonly known as: ZOCOR Take 20 mg by mouth at bedtime.   tamsulosin 0.4 MG Caps capsule Commonly known as: FLOMAX Take 2 capsules (0.8 mg total) by mouth daily after supper.   traMADol 50 MG tablet Commonly known as: ULTRAM Take 1 tablet (50 mg total) by mouth every 6 (six) hours as needed for moderate pain.  Allergies:  No Known Allergies  Family History: Family History  Problem Relation Age of Onset  . Diabetes Mother   . CAD Father     Social History:   reports that he has quit smoking. His smoking use included cigarettes and cigars. He smoked 0.00 packs per day. He has quit using smokeless tobacco. He reports that he does not drink alcohol or use drugs.  Physical Exam: BP (!) 179/66   Pulse 76   Ht 5\' 11"  (1.803 m)   Wt 175 lb (79.4 kg)   BMI 24.41 kg/m   Constitutional:  Alert and oriented, no acute distress, nontoxic  appearing HEENT: [place], AT Cardiovascular: No clubbing, cyanosis, or edema Respiratory: Normal respiratory effort, no increased work of breathing GU: Uncircumcised phallus with fully exposed glans. Mildly edematous band of tissue noted circumferentially just proximal to the glans. Glans without cyanosis, capillary refill <3 seconds. Skin: No rashes, bruises or suspicious lesions Neurologic: Grossly intact, no focal deficits, moving all 4 extremities Psychiatric: Normal mood and affect  Laboratory Data: Results for orders placed or performed in visit on 09/17/19  Bladder Scan (Post Void Residual) in office  Result Value Ref Range   Scan Result 311    Assessment & Plan:   1. Urinary retention Uptrending PVR today concerning for recurrent urinary retention.  Patient is at risk of urethral stricture recurrence if he is not sounding his urethra following dilation, however self-catheterization also increases his risk of repeat gross hematuria with possible clot retention.  Ultimately, I reiterated my recommendation to the patient today that he obtain a suprapubic catheter for bladder drainage.  In the interim, I recommended Foley catheter replacement to reduce his risk of prostatic trauma with self-catheterization.  Patient agreed with this.  See separate procedure note for details.  We will arrange follow-up with IR for SP tube placement. - Bladder Scan (Post Void Residual) in office  2. Paraphimosis Mild, noted on physical exam today.  I was able to reduce the paraphimosis in clinic.  I explained to the patient that this represents a urologic emergency and that he should make an effort to keep his foreskin reduced over the glans penis.  Counseled him to seek care in our office or proceed to the emergency room if overnight or on the weekends if this recurs and he is unable to reduce it at home.  He expressed understanding.  Return if symptoms worsen or fail to improve.  Debroah Loop,  PA-C  [facility] [address], [place], [place] [address] [phone]

## 2019-09-17 NOTE — Progress Notes (Signed)
Simple Catheter Placement  Due to urinary retention patient is present today for a foley cath placement.  Patient was cleaned and prepped in a sterile fashion with betadine and 2 tubes of 2% lidocaine jelly were instilled into the urethra. A 16 FR coud foley catheter was inserted, urine return was noted  317ml, urine was yellow in color.  The balloon was filled with 10cc of sterile water.  A leg bag was attached for drainage. Patient declined a night bag to take home and was given instruction on how to change from one bag to another.  Patient was given instruction on proper catheter care.  Patient tolerated well, no complications were noted   Performed by: Debroah Loop, PA-C

## 2019-09-21 NOTE — Progress Notes (Signed)
Patient on schedule for SPT placement 09/23/2019,spoke with patient, made aware to be NPO after MN, have driver for discharge after procedure and to be here @ 0730, stated understanding

## 2019-09-22 ENCOUNTER — Other Ambulatory Visit: Payer: Self-pay | Admitting: Radiology

## 2019-09-23 ENCOUNTER — Other Ambulatory Visit: Payer: Self-pay | Admitting: Diagnostic Radiology

## 2019-09-23 ENCOUNTER — Other Ambulatory Visit: Payer: Self-pay

## 2019-09-23 ENCOUNTER — Ambulatory Visit
Admission: RE | Admit: 2019-09-23 | Discharge: 2019-09-23 | Disposition: A | Discharge status: Home / Self Care | Payer: Medicare Other | Source: Ambulatory Visit | Attending: Physician Assistant | Admitting: Physician Assistant

## 2019-09-23 DIAGNOSIS — E039 Hypothyroidism, unspecified: Secondary | ICD-10-CM | POA: Insufficient documentation

## 2019-09-23 DIAGNOSIS — R339 Retention of urine, unspecified: Secondary | ICD-10-CM

## 2019-09-23 DIAGNOSIS — N400 Enlarged prostate without lower urinary tract symptoms: Secondary | ICD-10-CM | POA: Diagnosis not present

## 2019-09-23 DIAGNOSIS — Z87891 Personal history of nicotine dependence: Secondary | ICD-10-CM | POA: Diagnosis not present

## 2019-09-23 DIAGNOSIS — I10 Essential (primary) hypertension: Secondary | ICD-10-CM | POA: Diagnosis not present

## 2019-09-23 DIAGNOSIS — Z79899 Other long term (current) drug therapy: Secondary | ICD-10-CM | POA: Diagnosis not present

## 2019-09-23 HISTORY — DX: Unspecified hearing loss, unspecified ear: H91.90

## 2019-09-23 LAB — CBC
HCT: 27.5 % — ABNORMAL LOW (ref 39.0–52.0)
Hemoglobin: 9.5 g/dL — ABNORMAL LOW (ref 13.0–17.0)
MCH: 30 pg (ref 26.0–34.0)
MCHC: 34.5 g/dL (ref 30.0–36.0)
MCV: 86.8 fL (ref 80.0–100.0)
Platelets: 316 10*3/uL (ref 150–400)
RBC: 3.17 MIL/uL — ABNORMAL LOW (ref 4.22–5.81)
RDW: 14.3 % (ref 11.5–15.5)
WBC: 5.3 10*3/uL (ref 4.0–10.5)
nRBC: 0 % (ref 0.0–0.2)

## 2019-09-23 LAB — PROTIME-INR
INR: 1 (ref 0.8–1.2)
Prothrombin Time: 13.2 seconds (ref 11.4–15.2)

## 2019-09-23 MED ORDER — SODIUM CHLORIDE 0.9 % IV SOLN: INTRAVENOUS | Status: DC

## 2019-09-23 MED ORDER — CIPROFLOXACIN IN D5W 400 MG/200ML IV SOLN
400.0000 mg | INTRAVENOUS | Status: DC
  Filled 2019-09-23: qty 200

## 2019-09-23 MED ORDER — MIDAZOLAM HCL 5 MG/5ML IJ SOLN
INTRAMUSCULAR | Status: AC | PRN
  Administered 2019-09-23 (×2): 1 mg via INTRAVENOUS

## 2019-09-23 MED ORDER — MIDAZOLAM HCL 5 MG/5ML IJ SOLN
INTRAMUSCULAR | Status: AC
  Filled 2019-09-23: qty 5

## 2019-09-23 MED ORDER — FENTANYL CITRATE (PF) 100 MCG/2ML IJ SOLN
INTRAMUSCULAR | Status: AC
  Filled 2019-09-23: qty 2

## 2019-09-23 MED ORDER — FENTANYL CITRATE (PF) 100 MCG/2ML IJ SOLN
INTRAMUSCULAR | Status: AC | PRN
  Administered 2019-09-23: 50 ug via INTRAVENOUS
  Administered 2019-09-23: 25 ug via INTRAVENOUS

## 2019-09-23 MED ORDER — CIPROFLOXACIN IN D5W 400 MG/200ML IV SOLN
INTRAVENOUS | Status: AC
  Filled 2019-09-23: qty 200

## 2019-09-23 NOTE — Discharge Instructions (Signed)
Suprapubic Catheter Home Guide °A suprapubic catheter is a flexible tube that is used to drain urine from the bladder into a collection bag outside the body. The catheter is inserted into the bladder through a small opening in the lower abdomen, above the pubic bone (suprapubic area) and a few inches below your belly button (navel). A tiny balloon filled with germ-free (sterile) water helps to keep the catheter in place. °The collection bag must be emptied at least once a day and cleaned at least every other day. The collection bag can be put beside your bed at night and attached to your leg during the day. You may have a large collection bag to use at night and a smaller one to use during the day. °Your suprapubic catheter may need to be changed every 4-6 weeks, or as often as recommended by your health care provider. Healing of the tract where the catheter is placed can take 6 weeks to 6 months. During that time, your health care provider may change your catheter. Once the tract is well healed, you or a caregiver will change your suprapubic catheter at home. °What are the risks? °This catheter is safe to use. However, problems can occur, including: °· Blocked urine flow. This can occur if the catheter stops working, or if you have a blood clot in your bladder or in the catheter. °· Irritation of the skin around the catheter. °· Infection. This can happen if bacteria gets into your bladder. °Supplies needed: °· Two pairs of sterile gloves. °· Paper towels. °· Catheter. °· Two syringes. °· Sterile water. °· Sterile cleaning solution. °· Lubricant. °· Collection bags. °How to change the catheter ° °1. Drink plenty of fluids during the hours before you change the catheter. °2. Wash your hands with soap and water. If soap and water are not available, use hand sanitizer. °3. Draw up sterile water into a syringe to have ready to fill the new catheter balloon. The amount will depend on the size of the balloon. °4. Have  all of your supplies ready and close to you on a paper towel. °5. Lie on your back, sitting slightly upright so that you can see the catheter and opening. °6. Put on sterile gloves. °7. Clean the skin around the catheter opening using the sterile cleaning solution. °8. Remove the water from the balloon in the catheter using a syringe. °9. Slowly remove the catheter. If the catheter seems stuck, or if you have difficulty removing it: °? Do not pull on it. °? Call your health care provider right away. °10. Place the old catheter on a paper towel to discard later. °11. Take off the used gloves, and put on a new pair. °12. Put lubricant on the end of the new catheter that will go into your bladder. °13. Clean the skin around the catheter opening using the sterile cleaning solution. °14. Gently slide the catheter through the opening in your abdomen and into the tract that leads to your bladder. °15. Wait for some urine to start flowing through the catheter. °16. When urine starts to flow through the catheter, attach the collection bag to the end of the catheter. Make sure the connection is tight. °17. Use a syringe to fill the catheter balloon with sterile water. Fill to the amount directed by your health care provider. °18. Remove the gloves and wash your hands with soap and water. °How to care for the skin around the catheter °Follow your health care provider's instructions on   caring for your skin. °· Use a clean washcloth and soapy water to clean the skin around your catheter every day. Pat the area dry with a clean paper towel. °· Do not pull on the catheter. °· Do not use ointment or lotion on this area, unless told by your health care provider. °· Check the skin around the catheter every day for signs of infection. Check for: °? Redness, swelling, or pain. °? Fluid or blood. °? Warmth. °? Pus or a bad smell. °How to empty and clean the collection bag °Empty the large collection bag every 8 hours. Empty the small  collection bag when it is about ? full. Clean the collection bag every 2-3 days, or as often as told by your health care provider. To do this: °1. Wash your hands with soap and water. If soap and water are not available, use hand sanitizer. °2. Disconnect the bag from the catheter and immediately attach a new bag to the catheter. °3. Hold the used bag over the toilet or another container. °4. Turn the valve (spigot) at the bottom of the bag to empty the urine. Empty the used bag completely. °? Do not touch the opening of the spigot. °? Do not let the opening touch the toilet or container. °5. Close the spigot tightly when the bag is empty. °6. Clean the used bag in one of the following methods: °? According to the manufacturer's instructions. °? As told by your health care provider. °7. Let the bag dry completely. Put it in a clean plastic bag before storing it. °General tips ° °· Always wash your hands before and after caring for your catheter and collection bag. Use a mild, fragrance-free soap. If soap and water are not available, use hand sanitizer. °· Clean the outside of the catheter with soap and water as often as told by your health care provider. °· Always make sure there are no twists or kinks in the catheter tube. °· Always make sure there are no leaks in the catheter or collection bag. °· Always wear the leg bag below your knee. °· Make sure the overnight drainage bag is always lower than the level of your bladder, but do not let it touch the floor. Before you go to sleep, hang the bag inside a wastebasket that is covered by a clean plastic bag. °· Drink enough fluid to keep your urine pale yellow. °· Do not take baths, swim, or use a hot tub until your health care provider approves. Ask your health care provider if you may take showers. °Contact a heath care provider if: °· You leak urine. °· You have redness, swelling, or pain around your catheter. °· You have fluid or blood coming from your catheter  opening. °· Your catheter opening feels warm to the touch. °· You have pus or a bad smell coming from your catheter opening. °· You have a fever or chills. °· Your urine flow slows down. °· Your urine becomes cloudy or smelly. °Get help right away if: °· Your catheter comes out. °· You have: °? Nausea. °? Back pain. °? Difficulty changing your catheter. °? Blood in your urine. °? No urine flow for 1 hour. °Summary °· A suprapubic catheter is a flexible tube that is used to drain urine from the bladder into a collection bag outside the body. °· Your suprapubic catheter may need to be changed every 4-6 weeks, or as recommended by your health care provider. °· Follow instructions on how to   change the catheter and how to empty and clean the collection bag. °· Always wash your hands before and after caring for your catheter and collection bag. Drink enough fluid to keep your urine pale yellow. °· Get help right away if you have difficulty changing your catheter or if there is blood in your urine. °This information is not intended to replace advice given to you by your health care provider. Make sure you discuss any questions you have with your health care provider. °Document Revised: 07/24/2018 Document Reviewed: 05/07/2018 °Elsevier Patient Education © 2020 [employer] °Moderate Conscious Sedation, Adult, Care After °These instructions provide you with information about caring for yourself after your procedure. Your health care provider may also give you more specific instructions. Your treatment has been planned according to current medical practices, but problems sometimes occur. Call your health care provider if you have any problems or questions after your procedure. °What can I expect after the procedure? °After your procedure, it is common: °· To feel sleepy for several hours. °· To feel clumsy and have poor balance for several hours. °· To have poor judgment for several hours. °· To vomit if you eat too  soon. °Follow these instructions at home: °For at least 24 hours after the procedure: ° °· Do not: °? Participate in activities where you could fall or become injured. °? Drive. °? Use heavy machinery. °? Drink alcohol. °? Take sleeping pills or medicines that cause drowsiness. °? Make important decisions or sign legal documents. °? Take care of children on your own. °· Rest. °Eating and drinking °· Follow the diet recommended by your health care provider. °· If you vomit: °? Drink water, juice, or soup when you can drink without vomiting. °? Make sure you have little or no nausea before eating solid foods. °General instructions °· Have a responsible adult stay with you until you are awake and alert. °· Take over-the-counter and prescription medicines only as told by your health care provider. °· If you smoke, do not smoke without supervision. °· Keep all follow-up visits as told by your health care provider. This is important. °Contact a health care provider if: °· You keep feeling nauseous or you keep vomiting. °· You feel light-headed. °· You develop a rash. °· You have a fever. °Get help right away if: °· You have trouble breathing. °This information is not intended to replace advice given to you by your health care provider. Make sure you discuss any questions you have with your health care provider. °Document Revised: 03/15/2017 Document Reviewed: 07/23/2015 °Elsevier Patient Education © 2020 Elsevier Inc. ° °

## 2019-09-23 NOTE — Consult Note (Signed)
Chief Complaint: Patient was seen in consultation today for suprapubic tube placement at the request of Vaillancourt,Samantha  Referring Physician(s): Vaillancourt,Samantha  Patient Status: [place] - Out-pt  History of Present Illness: Nicholas Grimes is a 86 y.o. male with history of urinary retention and currently has a Foley catheter.  GU history is significant for a urethral stricture and status post urethral dilatation on 07/16/2019.  History of gross hematuria requiring cystoscopy for clot evacuation.  Patient lives at home by himself.  His wife passed away earlier this year.  The patient has not had COVID-19 and he has concerns about the vaccine.  He denies chest pain or shortness of breath.  His sister is with him today.  Past Medical History:  Diagnosis Date  . Arthritis   . Cancer ([place])    Basal Cell Skin Cancer  . HOH (hard of hearing)   . Hypertension   . Hypothyroidism   . Thyroid disease     Past Surgical History:  Procedure Laterality Date  . CHOLECYSTECTOMY    . CYSTOSCOPY WITH FULGERATION N/A 09/13/2019   Procedure: CYSTOSCOPY WITH FULGERATIO;  Surgeon: Irine Seal, MD;  Location: [facility] ORS;  Service: Urology;  Laterality: N/A;  . CYSTOSCOPY WITH URETHRAL DILATATION N/A 07/16/2019   Procedure: CYSTOSCOPY WITH URETHRAL DILATATION;  Surgeon: Abbie Sons, MD;  Location: [facility] ORS;  Service: Urology;  Laterality: N/A;  . INGUINAL HERNIA REPAIR Right 10/30/2016   Procedure: HERNIA REPAIR INGUINAL ADULT;  Surgeon: Leonie Green, MD;  Location: [facility];  Service: General;  Laterality: Right;  . INSERTION OF MESH  10/30/2016   Procedure: INSERTION OF MESH;  Surgeon: Leonie Green, MD;  Location: [facility] ORS;  Service: General;;  . PROSTATE SURGERY      Allergies: Patient has no known allergies.  Medications: Prior to Admission medications   Medication Sig Start Date End Date Taking? Authorizing Provider  doxycycline (VIBRAMYCIN) 100 MG capsule Take 1  capsule (100 mg total) by mouth 2 (two) times daily for 7 days. 09/16/19 09/23/19 Yes Vaillancourt, Samantha, PA-C  hydrochlorothiazide (HYDRODIURIL) 25 MG tablet Take 25 mg by mouth daily.  01/09/18  Yes [provider]  levothyroxine (SYNTHROID, LEVOTHROID) 75 MCG tablet Take 75 mcg by mouth daily before breakfast.    Yes [provider]  Multiple Vitamin (MULTIVITAMIN WITH MINERALS) TABS tablet Take 1 tablet by mouth daily at 12 noon. Cemtrum Silver   Yes [provider]  simvastatin (ZOCOR) 20 MG tablet Take 20 mg by mouth at bedtime.    Yes [provider]  tamsulosin (FLOMAX) 0.4 MG CAPS capsule Take 2 capsules (0.8 mg total) by mouth daily after supper. 12/02/17  Yes Mayo, Pete Pelt, MD  traMADol (ULTRAM) 50 MG tablet Take 1 tablet (50 mg total) by mouth every 6 (six) hours as needed for moderate pain. 07/16/19  Yes Stoioff, Ronda Fairly, MD  ciprofloxacin (CIPRO) 500 MG tablet Take 500 mg by mouth 2 (two) times daily. 09/14/19   [provider]  doxycycline (VIBRA-TABS) 100 MG tablet Take 100 mg by mouth 2 (two) times daily. 09/16/19   [provider]  finasteride (PROSCAR) 5 MG tablet Take 1 tablet (5 mg total) by mouth daily. Patient not taking: Reported on 09/23/2019 09/15/19   Jennye Boroughs, MD  oxybutynin (DITROPAN) 5 MG tablet Take 1 tablet (5 mg total) by mouth as needed for bladder spasms. Patient not taking: Reported on 09/23/2019 09/09/19   Debroah Loop, PA-C  Family History  Problem Relation Age of Onset  . Diabetes Mother   . CAD Father     Social History   Socioeconomic History  . Marital status: Widowed    Spouse name: deceased  . Number of children: 2  . Years of education: Not on file  . Highest education level: Associate degree: occupational, Hotel manager, or vocational program  Occupational History  . Not on file  Tobacco Use  . Smoking status: Former Smoker    Packs/day: 0.00    Types: Cigarettes, Cigars  .  Smokeless tobacco: Former Network engineer and Sexual Activity  . Alcohol use: No  . Drug use: No  . Sexual activity: Not Currently  Other Topics Concern  . Not on file  Social History Narrative  . Not on file   Social Determinants of Health   Financial Resource Strain:   . Difficulty of Paying Living Expenses:   Food Insecurity:   . Worried About Charity fundraiser in the Last Year:   . Arboriculturist in the Last Year:   Transportation Needs:   . Film/video editor (Medical):   Marland Kitchen Lack of Transportation (Non-Medical):   Physical Activity:   . Days of Exercise per Week:   . Minutes of Exercise per Session:   Stress:   . Feeling of Stress :   Social Connections:   . Frequency of Communication with Friends and Family:   . Frequency of Social Gatherings with Friends and Family:   . Attends Religious Services:   . Active Member of Clubs or Organizations:   . Attends Archivist Meetings:   Marland Kitchen Marital Status:      Review of Systems  Constitutional: Negative for chills and fever.  Respiratory: Negative.   Cardiovascular: Negative.   Gastrointestinal: Negative.   Genitourinary: Positive for difficulty urinating and hematuria.  Neurological: Negative.     Vital Signs: BP (!) 156/72   Pulse 64   Temp 97.9 F (36.6 C) (Oral)   Resp 16   Ht 6' (1.829 m)   Wt 77.1 kg   SpO2 99%   BMI 23.06 kg/m   Physical Exam Vitals reviewed.  Constitutional:      Appearance: He is not ill-appearing.  Cardiovascular:     Rate and Rhythm: Normal rate and regular rhythm.  Pulmonary:     Effort: Pulmonary effort is normal.     Breath sounds: Normal breath sounds.  Abdominal:     General: Abdomen is flat.     Palpations: Abdomen is soft.  Genitourinary:    Comments: Foley catheter in place.    Imaging: DG OR UROLOGY CYSTO IMAGE ([facility] ONLY)  Result Date: 09/13/2019 There is no interpretation for this exam.  This order is for images obtained during a surgical  procedure.  Please See "Surgeries" Tab for more information regarding the procedure.    Labs:  CBC: Recent Labs    09/12/19 0656 09/12/19 0656 09/12/19 2031 09/12/19 2031 09/13/19 0216 09/13/19 0942 09/13/19 1534 09/14/19 0508  WBC 3.7*  --  6.0  --  4.4  --   --  5.4  HGB 12.6*   < > 11.0*   < > 11.0* 10.7* 10.2* 8.8*  HCT 36.7*   < > 32.2*   < > 31.1* 30.5* 28.9* 26.4*  PLT 189  --  193  --  187  --   --  193   < > = values in this interval not displayed.  COAGS: Recent Labs    09/23/19 0737  INR 1.0    BMP: Recent Labs    09/12/19 0656 09/13/19 0216 09/14/19 0508  NA 128* 132* 132*  K 3.0* 3.8 4.3  CL 91* 99 101  CO2 26 27 26   GLUCOSE 161* 114* 110*  BUN 13 15 14   CALCIUM 9.0 8.4* 8.5*  CREATININE 1.03 0.93 0.97  GFRNONAA >60 >60 >60  GFRAA >60 >60 >60    LIVER FUNCTION TESTS: No results for input(s): BILITOT, AST, ALT, ALKPHOS, PROT, ALBUMIN in the last 8760 hours.  TUMOR MARKERS: No results for input(s): AFPTM, CEA, CA199, CHROMGRNA in the last 8760 hours.  Assessment and Plan:  86 year old with history of urethral stricture and urinary retention.  Request for suprapubic catheter.  Patient currently has a Foley catheter.  We discussed a CT-guided placement of a suprapubic catheter.  Patient understands that the tube we placed today will need to be upsized over the next few months.  Risks of the procedure including bleeding and infection were discussed.  Patient will be given IV ciprofloxacin for the procedure.  Informed consent was obtained from the patient.  Thank you for this interesting consult.  I greatly enjoyed meeting Nicholas Grimes and look forward to participating in their care.  A copy of this report was sent to the requesting provider on this date.  Electronically Signed: Burman Riis, MD 09/23/2019, 8:13 AM   I spent a total of  15 Minutes   in face to face in clinical consultation, greater than 50% of which was  counseling/coordinating care for suprapubic catheter placement.

## 2019-09-23 NOTE — Procedures (Signed)
Interventional Radiology Procedure:   Indications:  Urinary retention  Procedure: Suprapubic catheter placement with CT  Findings: Enlarged prostate.  12 Fr drain placed.  Complications: None     EBL: less than 10 ml  Plan: Observe for 3 hours prior to discharge.   Allesandra Huebsch R. Anselm Pancoast, MD  Pager: [phone]

## 2019-09-24 LAB — SUSCEPTIBILITY, GRAM POS RODS

## 2019-09-24 LAB — SPECIMEN STATUS REPORT

## 2019-09-25 ENCOUNTER — Ambulatory Visit: Payer: Medicare Other

## 2019-09-25 ENCOUNTER — Other Ambulatory Visit: Payer: Self-pay

## 2019-09-25 DIAGNOSIS — R339 Retention of urine, unspecified: Secondary | ICD-10-CM

## 2019-09-25 NOTE — Progress Notes (Signed)
Patient is present for nurse visit. He recently had an SPT placed on 09/23/19. He complains that the tubing is too long and his drainage bag is not staying stationary. I attached his drainage bag more securely with leg straps  and showed him how to change his leg bag should he need to. Sent patient home with new leg bag and straps.

## 2019-10-09 ENCOUNTER — Ambulatory Visit: Payer: Self-pay | Admitting: Physician Assistant

## 2019-10-20 NOTE — Progress Notes (Signed)
Patient on schedule for SPT upsize 10/23/2019, patient called, and made aware to be here @ 0730, NPO after MN, as well as driver post procedure for discharge. Stated understanding.

## 2019-10-22 ENCOUNTER — Other Ambulatory Visit: Payer: Self-pay | Admitting: Student

## 2019-10-23 ENCOUNTER — Other Ambulatory Visit: Payer: Self-pay

## 2019-10-23 ENCOUNTER — Ambulatory Visit
Admission: RE | Admit: 2019-10-23 | Discharge: 2019-10-23 | Disposition: A | Discharge status: Home / Self Care | Payer: Medicare Other | Source: Ambulatory Visit | Attending: Diagnostic Radiology | Admitting: Diagnostic Radiology

## 2019-10-23 ENCOUNTER — Other Ambulatory Visit: Payer: Self-pay | Admitting: Urology

## 2019-10-23 DIAGNOSIS — Z435 Encounter for attention to cystostomy: Secondary | ICD-10-CM | POA: Insufficient documentation

## 2019-10-23 DIAGNOSIS — R339 Retention of urine, unspecified: Secondary | ICD-10-CM

## 2019-10-23 HISTORY — PX: IR CATHETER TUBE CHANGE: IMG717

## 2019-10-23 MED ORDER — IODIXANOL 320 MG/ML IV SOLN
50.0000 mL | Freq: Once | INTRAVENOUS | Status: AC | PRN
  Administered 2019-10-23: 10 mL

## 2019-10-23 MED ORDER — LIDOCAINE HCL (PF) 1 % IJ SOLN
INTRAMUSCULAR | Status: AC
  Filled 2019-10-23: qty 30

## 2019-10-23 MED ORDER — LIDOCAINE VISCOUS HCL 2 % MT SOLN
OROMUCOSAL | Status: AC
  Filled 2019-10-23: qty 15

## 2019-10-23 NOTE — Discharge Instructions (Signed)
Suprapubic Catheter Home Guide °A suprapubic catheter is a flexible tube that is used to drain urine from the bladder into a collection bag outside the body. The catheter is inserted into the bladder through a small opening in the lower abdomen, above the pubic bone (suprapubic area) and a few inches below your belly button (navel). A tiny balloon filled with germ-free (sterile) water helps to keep the catheter in place. °The collection bag must be emptied at least once a day and cleaned at least every other day. The collection bag can be put beside your bed at night and attached to your leg during the day. You may have a large collection bag to use at night and a smaller one to use during the day. °Your suprapubic catheter may need to be changed every 4-6 weeks, or as often as recommended by your health care provider. Healing of the tract where the catheter is placed can take 6 weeks to 6 months. During that time, your health care provider may change your catheter. Once the tract is well healed, you or a caregiver will change your suprapubic catheter at home. °What are the risks? °This catheter is safe to use. However, problems can occur, including: °· Blocked urine flow. This can occur if the catheter stops working, or if you have a blood clot in your bladder or in the catheter. °· Irritation of the skin around the catheter. °· Infection. This can happen if bacteria gets into your bladder. °Supplies needed: °· Two pairs of sterile gloves. °· Paper towels. °· Catheter. °· Two syringes. °· Sterile water. °· Sterile cleaning solution. °· Lubricant. °· Collection bags. °How to change the catheter ° °1. Drink plenty of fluids during the hours before you change the catheter. °2. Wash your hands with soap and water. If soap and water are not available, use hand sanitizer. °3. Draw up sterile water into a syringe to have ready to fill the new catheter balloon. The amount will depend on the size of the balloon. °4. Have  all of your supplies ready and close to you on a paper towel. °5. Lie on your back, sitting slightly upright so that you can see the catheter and opening. °6. Put on sterile gloves. °7. Clean the skin around the catheter opening using the sterile cleaning solution. °8. Remove the water from the balloon in the catheter using a syringe. °9. Slowly remove the catheter. If the catheter seems stuck, or if you have difficulty removing it: °? Do not pull on it. °? Call your health care provider right away. °10. Place the old catheter on a paper towel to discard later. °11. Take off the used gloves, and put on a new pair. °12. Put lubricant on the end of the new catheter that will go into your bladder. °13. Clean the skin around the catheter opening using the sterile cleaning solution. °14. Gently slide the catheter through the opening in your abdomen and into the tract that leads to your bladder. °15. Wait for some urine to start flowing through the catheter. °16. When urine starts to flow through the catheter, attach the collection bag to the end of the catheter. Make sure the connection is tight. °17. Use a syringe to fill the catheter balloon with sterile water. Fill to the amount directed by your health care provider. °18. Remove the gloves and wash your hands with soap and water. °How to care for the skin around the catheter °Follow your health care provider's instructions on   caring for your skin. °· Use a clean washcloth and soapy water to clean the skin around your catheter every day. Pat the area dry with a clean paper towel. °· Do not pull on the catheter. °· Do not use ointment or lotion on this area, unless told by your health care provider. °· Check the skin around the catheter every day for signs of infection. Check for: °? Redness, swelling, or pain. °? Fluid or blood. °? Warmth. °? Pus or a bad smell. °How to empty and clean the collection bag °Empty the large collection bag every 8 hours. Empty the small  collection bag when it is about ? full. Clean the collection bag every 2-3 days, or as often as told by your health care provider. To do this: °1. Wash your hands with soap and water. If soap and water are not available, use hand sanitizer. °2. Disconnect the bag from the catheter and immediately attach a new bag to the catheter. °3. Hold the used bag over the toilet or another container. °4. Turn the valve (spigot) at the bottom of the bag to empty the urine. Empty the used bag completely. °? Do not touch the opening of the spigot. °? Do not let the opening touch the toilet or container. °5. Close the spigot tightly when the bag is empty. °6. Clean the used bag in one of the following methods: °? According to the manufacturer's instructions. °? As told by your health care provider. °7. Let the bag dry completely. Put it in a clean plastic bag before storing it. °General tips ° °· Always wash your hands before and after caring for your catheter and collection bag. Use a mild, fragrance-free soap. If soap and water are not available, use hand sanitizer. °· Clean the outside of the catheter with soap and water as often as told by your health care provider. °· Always make sure there are no twists or kinks in the catheter tube. °· Always make sure there are no leaks in the catheter or collection bag. °· Always wear the leg bag below your knee. °· Make sure the overnight drainage bag is always lower than the level of your bladder, but do not let it touch the floor. Before you go to sleep, hang the bag inside a wastebasket that is covered by a clean plastic bag. °· Drink enough fluid to keep your urine pale yellow. °· Do not take baths, swim, or use a hot tub until your health care provider approves. Ask your health care provider if you may take showers. °Contact a heath care provider if: °· You leak urine. °· You have redness, swelling, or pain around your catheter. °· You have fluid or blood coming from your catheter  opening. °· Your catheter opening feels warm to the touch. °· You have pus or a bad smell coming from your catheter opening. °· You have a fever or chills. °· Your urine flow slows down. °· Your urine becomes cloudy or smelly. °Get help right away if: °· Your catheter comes out. °· You have: °? Nausea. °? Back pain. °? Difficulty changing your catheter. °? Blood in your urine. °? No urine flow for 1 hour. °Summary °· A suprapubic catheter is a flexible tube that is used to drain urine from the bladder into a collection bag outside the body. °· Your suprapubic catheter may need to be changed every 4-6 weeks, or as recommended by your health care provider. °· Follow instructions on how to   change the catheter and how to empty and clean the collection bag. °· Always wash your hands before and after caring for your catheter and collection bag. Drink enough fluid to keep your urine pale yellow. °· Get help right away if you have difficulty changing your catheter or if there is blood in your urine. °This information is not intended to replace advice given to you by your health care provider. Make sure you discuss any questions you have with your health care provider. °Document Revised: 07/24/2018 Document Reviewed: 05/07/2018 °Elsevier Patient Education © 2020 [employer] Inc[employer] ° °

## 2019-10-29 ENCOUNTER — Telehealth: Payer: Self-pay

## 2019-10-29 NOTE — Telephone Encounter (Signed)
Spoke with patient, he is having some pressure and urgency. He recently had a SPT change on 10/23/2019 in IR. He did notice some blood this morning in his bag. His urine is now clear, he is currently draining. As per PA Zara Council, restart his Oxybutnin 5mg . If still symptomatic tomorrow call us to schedule appointment.

## 2019-10-30 ENCOUNTER — Encounter: Payer: Self-pay | Admitting: Physician Assistant

## 2019-10-30 ENCOUNTER — Ambulatory Visit: Payer: Medicare Other | Admitting: Physician Assistant

## 2019-10-30 ENCOUNTER — Other Ambulatory Visit: Payer: Self-pay

## 2019-10-30 VITALS — BP 156/80 | HR 85 | Ht 71.0 in | Wt 175.0 lb

## 2019-10-30 DIAGNOSIS — R3989 Other symptoms and signs involving the genitourinary system: Secondary | ICD-10-CM

## 2019-10-30 DIAGNOSIS — R310 Gross hematuria: Secondary | ICD-10-CM | POA: Diagnosis not present

## 2019-10-30 LAB — BLADDER SCAN AMB NON-IMAGING: Scan Result: 70 mL

## 2019-10-30 NOTE — Progress Notes (Signed)
10/30/2019 4:18 PM   Nicholas Grimes 07-Mar-1931 [id number]  CC: Chief Complaint  Patient presents with  . Abdominal Pain  . Hematuria   HPI: Nicholas Grimes is a 86 y.o. male with PMH urethral stricture disease s/p suprapubic catheter placement with IR, last exchange 7 days ago who presents today for evaluation of possible UTI.   Today he reports intermittent gross hematuria since SP tube exchange.  He is also having lower abdominal pressure with the urge to urinate.  He has been taking oxybutynin 5 mg once daily and does not believe this is helping.  He is concerned he may have an infection or that his SP tube is not draining well.  Urine sample obtained from his SP tube today.  UA dip unable to be read due to color interference with gross hematuria; urine microscopy with >30 RBCs/HPF.  SP tube clamped for 30 minutes with bladder scan volume 70 mL.  PMH: Past Medical History:  Diagnosis Date  . Arthritis   . Cancer ([place])    Basal Cell Skin Cancer  . HOH (hard of hearing)   . Hypertension   . Hypothyroidism   . Thyroid disease     Surgical History: Past Surgical History:  Procedure Laterality Date  . CHOLECYSTECTOMY    . CYSTOSCOPY WITH FULGERATION N/A 09/13/2019   Procedure: CYSTOSCOPY WITH FULGERATIO;  Surgeon: Irine Seal, MD;  Location: [facility] ORS;  Service: Urology;  Laterality: N/A;  . CYSTOSCOPY WITH URETHRAL DILATATION N/A 07/16/2019   Procedure: CYSTOSCOPY WITH URETHRAL DILATATION;  Surgeon: Abbie Sons, MD;  Location: [facility] ORS;  Service: Urology;  Laterality: N/A;  . INGUINAL HERNIA REPAIR Right 10/30/2016   Procedure: HERNIA REPAIR INGUINAL ADULT;  Surgeon: Leonie Green, MD;  Location: [facility] ORS;  Service: General;  Laterality: Right;  . INSERTION OF MESH  10/30/2016   Procedure: INSERTION OF MESH;  Surgeon: Leonie Green, MD;  Location: [facility] ORS;  Service: General;;  . IR CATHETER TUBE CHANGE  10/23/2019  . PROSTATE SURGERY      Home  Medications:  Allergies as of 10/30/2019   No Known Allergies     Medication List       Accurate as of October 30, 2019  4:18 PM. If you have any questions, ask your nurse or doctor.        STOP taking these medications   ciprofloxacin 500 MG tablet Commonly known as: CIPRO Stopped by: Debroah Loop, PA-C     TAKE these medications   ALPRAZolam 0.25 MG tablet Commonly known as: XANAX Take 0.25 mg by mouth 2 (two) times daily as needed.   doxazosin 8 MG tablet Commonly known as: CARDURA Take 8 mg by mouth daily.   finasteride 5 MG tablet Commonly known as: PROSCAR Take 1 tablet (5 mg total) by mouth daily.   hydrochlorothiazide 25 MG tablet Commonly known as: HYDRODIURIL Take 25 mg by mouth daily.   levothyroxine 75 MCG tablet Commonly known as: SYNTHROID Take 75 mcg by mouth daily before breakfast.   multivitamin with minerals Tabs tablet Take 1 tablet by mouth daily at 12 noon. Cemtrum Silver   oxybutynin 5 MG tablet Commonly known as: DITROPAN Take 1 tablet (5 mg total) by mouth as needed for bladder spasms.   simvastatin 20 MG tablet Commonly known as: ZOCOR Take 20 mg by mouth at bedtime.   tamsulosin 0.4 MG Caps capsule Commonly known as: FLOMAX Take 2 capsules (0.8 mg total) by mouth daily after  supper.   traMADol 50 MG tablet Commonly known as: ULTRAM Take 1 tablet (50 mg total) by mouth every 6 (six) hours as needed for moderate pain.       Allergies:  No Known Allergies  Family History: Family History  Problem Relation Age of Onset  . Diabetes Mother   . CAD Father     Social History:   reports that he has quit smoking. His smoking use included cigarettes and cigars. He smoked 0.00 packs per day. He has quit using smokeless tobacco. He reports that he does not drink alcohol and does not use drugs.  Physical Exam: BP (!) 156/80 (BP Location: Left Arm, Patient Position: Sitting, Cuff Size: Normal)   Pulse 85   Ht 5\' 11"  (1.803 m)    Wt 175 lb (79.4 kg)   BMI 24.41 kg/m   Constitutional:  Alert and oriented, no acute distress, nontoxic appearing HEENT: [place], AT Cardiovascular: No clubbing, cyanosis, or edema Respiratory: Normal respiratory effort, no increased work of breathing GU: Suprapubic catheter in place draining cherry red urine.  Insertion site noted to be clean and dry with intact securing stitch.  No purulence or erythema noted.  No suprapubic tenderness. Neurologic: Grossly intact, no focal deficits, moving all 4 extremities Psychiatric: Normal mood and affect  Laboratory Data: Results for orders placed or performed in visit on 10/30/19  Microscopic Examination   Urine  Result Value Ref Range   WBC, UA 0-5 0 - 5 /hpf   RBC >30 (A) 0 - 2 /hpf   Epithelial Cells (non renal) 0-10 0 - 10 /hpf   Bacteria, UA None seen None seen/Few  Urinalysis, Complete  Result Value Ref Range   Specific Gravity, UA 1.015 1.005 - 1.030   pH, UA CANCELED    Color, UA Red (A) Yellow   Appearance Ur Cloudy (A) Clear   Protein,UA CANCELED    Glucose, UA CANCELED    Ketones, UA CANCELED    Microscopic Examination See below:   BLADDER SCAN AMB NON-IMAGING  Result Value Ref Range   Scan Result 70 mL   Assessment & Plan:   86 year old male presents for evaluation of gross hematuria and lower abdominal pain with urinary urgency 1 week after last SP tube exchange with interventional radiology. 1. Hematuria, gross UA notable for hematuria only, reassuring for infection.  Reassured patient that hematuria can be a normal finding after suprapubic catheter exchange and that this is expected to resolve.  Counseled him to remain well-hydrated to dilute his urine.   - Urinalysis, Complete  2. Bladder pain Bladder scan reassuring for clogged suprapubic catheter. Additionally, I flushed his suprapubic catheter with 10 cc of sterile saline and reconnected it to his drainage bag, revealing easy drainage into the bag.  I reassured the  patient that his catheter is not clogged.  Explained that his bladder pain is likely bladder spasms.  Counseled him to increase oxybutynin to every 8 hours as needed for management of his symptoms.  He expressed understanding. - BLADDER SCAN AMB NON-IMAGING   Return if symptoms worsen or fail to improve.  Debroah Loop, PA-C  [facility] [address] [place], [place] [address] [phone]

## 2019-10-31 LAB — URINALYSIS, COMPLETE: Specific Gravity, UA: 1.015 (ref 1.005–1.030)

## 2019-10-31 LAB — MICROSCOPIC EXAMINATION
Bacteria, UA: NONE SEEN
RBC, Urine: >30 /hpf — AB (ref 0–2)

## 2019-11-03 ENCOUNTER — Other Ambulatory Visit: Payer: Self-pay | Admitting: Radiology

## 2019-11-03 DIAGNOSIS — R339 Retention of urine, unspecified: Secondary | ICD-10-CM

## 2019-11-20 ENCOUNTER — Other Ambulatory Visit: Payer: Self-pay

## 2019-11-20 ENCOUNTER — Ambulatory Visit
Admission: RE | Admit: 2019-11-20 | Discharge: 2019-11-20 | Disposition: A | Discharge status: Home / Self Care | Payer: Medicare Other | Source: Ambulatory Visit | Attending: Physician Assistant | Admitting: Physician Assistant

## 2019-11-20 DIAGNOSIS — Z435 Encounter for attention to cystostomy: Secondary | ICD-10-CM | POA: Diagnosis present

## 2019-11-20 DIAGNOSIS — R339 Retention of urine, unspecified: Secondary | ICD-10-CM

## 2019-11-20 HISTORY — PX: IR CATHETER TUBE CHANGE: IMG717

## 2019-11-20 MED ORDER — HYDROCODONE-ACETAMINOPHEN 5-325 MG PO TABS
1.0000 | ORAL_TABLET | Freq: Once | ORAL | Status: AC
  Administered 2019-11-20: 1 via ORAL
  Filled 2019-11-20: qty 1

## 2019-11-20 MED ORDER — IODIXANOL 320 MG/ML IV SOLN
50.0000 mL | Freq: Once | INTRAVENOUS | Status: AC
  Administered 2019-11-20: 10 mL

## 2019-11-20 MED ORDER — HYDROCODONE-ACETAMINOPHEN 5-325 MG PO TABS
ORAL_TABLET | ORAL | Status: AC
  Filled 2019-11-20: qty 1

## 2019-11-20 NOTE — Discharge Instructions (Signed)
Suprapubic Catheter Replacement  Suprapubic catheter replacement is a procedure to remove an old catheter and insert a new, clean catheter. A suprapubic catheter is a rubber tube that drains urine from the bladder into a collection bag outside the body. The catheter is inserted into the bladder through a small opening in the lower abdomen, near the center of the body, above the pubic bone (suprapubicarea). There is a tiny balloon filled with germ-free (sterile) water on the end of the catheter that is in the bladder. The balloon helps to keep the catheter in place. If you need to wear a catheter for a long period of time, you may be instructed to replace the catheter yourself. Usually, suprapubic catheters need to be replaced every 4-6 weeks, or as often as told by your health care provider. What are the risks? Generally, this is a safe procedure. However, problems may occur, including failure to get the catheter into the bladder. What happens before the procedure?  You may have an exam or testing, including a blood or urine sample.  Ask your health care provider what steps will be taken to help prevent infection. What happens during the procedure?  You will lie on your back.  The water from the balloon will be removed using a syringe.  The catheter will be slowly removed.  Lubricant will be applied to the end of the new catheter that will go into your bladder.  The new catheter will be inserted through the opening in your abdomen. Your health care provider will slide the catheter into your bladder.  Your health care provider will wait for some urine to start flowing through the catheter. When this happens, a syringe will be used to fill the balloon with sterile water.  A collection bag will be attached to the end of the catheter. The procedure may vary among health care providers and hospitals. What can I expect after procedure? After the procedure, it is common to have:  Some  discomfort around the opening in your abdomen. Follow these instructions at home: Caring for the skin around the catheter Use a clean washcloth and soapy water to clean the skin around your catheter every day. Pat the area dry with a clean towel.  Do not pull on the catheter.  Do not use ointment or lotion on this area unless told by your health care provider.  Check the skin around the catheter every day for signs of infection. Check for: ? Redness, swelling, or pain. ? Fluid or blood. ? Warmth. ? Pus or a bad smell. Caring for the catheter  Clean the catheter with soap and water as often as told by your health care provider.  Always make sure there are no twists or curls (kinks) in the catheter.  As soon as you are able to move, you may use a leg bag to collect the urine. ? Make sure that the tubing is straight and without kinks. ? Wrap an ace bandage gently over the tubing and around your leg to minimize the risk of the bag getting pulled out. Emptying the collection bag Empty the large collection bag every 8 hours. Empty the small collection bag when it is about ? full. To empty your large or small collection bag, take the following steps:  Always keep the bag below the level of the catheter. This keeps urine from flowing backward into the catheter.  Hold the bag over the toilet or another container. Turn the valve (spigot) at the bottom of   the bag to empty the urine. ? Do not touch the opening of the spigot. ? Do not let the opening touch the toilet or container.  Close the spigot tightly when the bag is empty. Cleaning the collection bag   Wash your hands with soap and water. If soap and water are not available, use hand sanitizer.  Disconnect the bag from the catheter and immediately attach a new bag to the catheter.  Empty the used bag completely.  Clean the used bag according to the manufacturer's instructions, or as told by your health care provider.  Let the bag  dry completely, and put it in a clean plastic bag before storing it. General instructions   Always wash your hands before and after caring for your catheter and collection bag. Use soap and water. If soap and water are not available, use hand sanitizer.  Always make sure there are no leaks in the catheter or collection bag.  Drink enough fluid to keep your urine pale yellow.  If you were prescribed an antibiotic medicine, take it as told by your health care provider. Do not stop taking the antibiotic even if you start to feel better.  Do not take baths, swim, or use a hot tub.  Keep all follow-up visits as told by your health care provider. This is important. Contact a health care provider if:  You leak urine.  You have redness, swelling, or pain around your catheter opening.  You have fluid or blood coming from your catheter opening.  Your catheter opening feels warm to the touch.  You have pus or a bad smell coming from your catheter opening.  You have a fever or chills.  Your urine flow slows down.  Your urine becomes cloudy or smelly. Get help right away if:  Your catheter comes out.  You feel nauseous.  You have back pain.  You have difficulty changing your catheter.  You have blood in your urine.  You have no urine flow for 1 hour. Summary  Suprapubic catheter replacement is a procedure to remove an old catheter and insert a new, clean catheter.  Make sure that you understand how to care for your catheter, your collection bag, and the opening in your abdomen.  Always wash your hands before and after caring for your catheter and collection bag.  Contact a health care provider if you leak urine or have a fever or any signs of infection around the catheter opening, or if your urine becomes cloudy or smelly.  Get help right away if your catheter comes out or you have nausea, back pain, blood in your urine, no urine flow for 1 hour, or difficulty changing your  catheter. This information is not intended to replace advice given to you by your health care provider. Make sure you discuss any questions you have with your health care provider. Document Revised: 11/20/2017 Document Reviewed: 11/20/2017 Elsevier Patient Education  2020 Elsevier Inc.  

## 2019-11-20 NOTE — Procedures (Signed)
Interventional Radiology Procedure Note  Procedure: fluoro exhg of the SP tube to 16 fr council tip cath    Complications: None  Estimated Blood Loss:  min  Findings: Confirmed in the bladder    M. Daryll Brod, MD

## 2019-12-23 ENCOUNTER — Ambulatory Visit: Payer: Medicare Other | Admitting: Physician Assistant

## 2019-12-23 ENCOUNTER — Other Ambulatory Visit: Payer: Self-pay

## 2019-12-23 DIAGNOSIS — N35912 Unspecified bulbous urethral stricture, male: Secondary | ICD-10-CM | POA: Diagnosis not present

## 2019-12-23 NOTE — Progress Notes (Signed)
Suprapubic Cath Change  Patient is present today for a suprapubic catheter change due to urinary retention.  84ml of water was drained from the balloon, a 16FR council tip foley cath was removed from the tract without difficulty.  Site was cleaned and prepped in a sterile fashion with betadine.  A 16FR foley cath was replaced into the tract no complications were noted. Urine return was noted, 10 ml of sterile water was inflated into the balloon and a leg bag was attached for drainage.  Patient tolerated well. He declined additional drainage bags.     Performed by: Debroah Loop, PA-C   Follow up: Return in about 4 weeks (around 01/20/2020) for SPT exchange.

## 2020-01-21 ENCOUNTER — Other Ambulatory Visit: Payer: Self-pay

## 2020-01-21 ENCOUNTER — Ambulatory Visit: Payer: Medicare Other | Admitting: Physician Assistant

## 2020-01-21 DIAGNOSIS — Z435 Encounter for attention to cystostomy: Secondary | ICD-10-CM | POA: Diagnosis not present

## 2020-01-21 NOTE — Progress Notes (Signed)
Suprapubic Cath Change  Patient is present today for a suprapubic catheter change due to urinary retention.  40ml of water was drained from the balloon, a 16FR foley cath was removed from the tract with out difficulty.  Site was cleaned and prepped in a sterile fashion with betadine.  A 16FR foley cath was replaced into the tract no complications were noted. Urine return was not noted, 10 ml of sterile water was inflated into the balloon and a leg bag was attached for drainage. Placement confirmed by advancing the catheter and retracting it to feel resistance against the abdominal wall. Patient tolerated well. He declined additional leg bags.    Performed by: Debroah Loop, PA-C   Follow up: Return in about 4 weeks (around 02/18/2020) for SPT exchange.    Additional notes: Counseled patient that he may stop Flomax and to inform his PCP of this med change due to possible BP changes associated with discontinuation. Patient reports BP is well controlled on his current regimen and he would like to continue Flomax for the time being; will re-discuss at next visit.

## 2020-02-25 ENCOUNTER — Ambulatory Visit: Payer: Medicare Other | Admitting: Physician Assistant

## 2020-02-25 ENCOUNTER — Other Ambulatory Visit: Payer: Self-pay

## 2020-02-25 VITALS — BP 173/71 | HR 121 | Ht 72.0 in | Wt 163.0 lb

## 2020-02-25 DIAGNOSIS — Z435 Encounter for attention to cystostomy: Secondary | ICD-10-CM | POA: Diagnosis not present

## 2020-02-25 NOTE — Progress Notes (Signed)
Suprapubic Cath Change  Patient is present today for a suprapubic catheter change due to urinary retention.  57ml of water was drained from the balloon, a 16FR foley cath was removed from the tract without difficulty.  Site was cleaned and prepped in a sterile fashion with betadine.  A 16FR foley cath was replaced into the tract no complications were noted. Urine return was noted, 10 ml of sterile water was inflated into the balloon and a leg bag was attached for drainage.  Patient tolerated well.     Performed by: Debroah Loop, PA-C   Additional notes: Patient prefers to continue Flomax due to hypertension control.  Follow up: Return in about 4 weeks (around 03/24/2020) for SPT exchange.

## 2020-03-24 ENCOUNTER — Encounter: Payer: Self-pay | Admitting: Physician Assistant

## 2020-03-24 ENCOUNTER — Ambulatory Visit: Payer: Medicare Other | Admitting: Physician Assistant

## 2020-03-24 ENCOUNTER — Other Ambulatory Visit: Payer: Self-pay

## 2020-03-24 VITALS — BP 166/80 | HR 109 | Ht 70.0 in | Wt 161.0 lb

## 2020-03-24 DIAGNOSIS — Z435 Encounter for attention to cystostomy: Secondary | ICD-10-CM

## 2020-03-24 NOTE — Progress Notes (Signed)
Suprapubic Cath Change  Patient is present today for a suprapubic catheter change due to urinary retention.  28ml of water was drained from the balloon, a 16FR foley cath was removed from the tract without difficulty.  Site was cleaned and prepped in a sterile fashion with betadine.  A 16FR foley cath was replaced into the tract no complications were noted. Urine return was noted, 10 ml of sterile water was inflated into the balloon and a leg bag was attached for drainage.  Patient tolerated well.   Performed by: Debroah Loop, PA-C   Follow up: Return in about 4 weeks (around 04/21/2020) for SPT exchange.

## 2020-04-21 ENCOUNTER — Ambulatory Visit: Payer: Medicare Other | Admitting: Physician Assistant

## 2020-04-21 ENCOUNTER — Other Ambulatory Visit: Payer: Self-pay

## 2020-04-21 DIAGNOSIS — Z435 Encounter for attention to cystostomy: Secondary | ICD-10-CM

## 2020-04-21 NOTE — Progress Notes (Signed)
Suprapubic Cath Change  Patient is present today for a suprapubic catheter change due to urinary retention.  89ml of water was drained from the balloon, a 16FR foley cath was removed from the tract without difficulty.  Site was cleaned and prepped in a sterile fashion with betadine.  A 16FR foley cath was replaced into the tract no complications were noted. Urine return was noted, 10 ml of sterile water was inflated into the balloon and a leg bag was attached for drainage.  Patient tolerated well.  Performed by: Carman Ching, PA-C   Follow up: No follow-ups on file.

## 2020-05-20 ENCOUNTER — Ambulatory Visit: Payer: Medicare Other | Admitting: Physician Assistant

## 2020-05-20 ENCOUNTER — Other Ambulatory Visit: Payer: Self-pay

## 2020-05-20 DIAGNOSIS — Z435 Encounter for attention to cystostomy: Secondary | ICD-10-CM | POA: Diagnosis not present

## 2020-05-20 NOTE — Progress Notes (Signed)
Suprapubic Cath Change  Patient is present today for a suprapubic catheter change due to urinary retention.  66ml of water was drained from the balloon, a 16FR foley cath was removed from the tract without difficulty.  Site was cleaned and prepped in a sterile fashion with betadine.  A 16FR foley cath was replaced into the tract no complications were noted. Urine return was not noted and patient was counseled to return to clinic if urine didn't start flowing within 1-2 hours, 10 ml of sterile water was inflated into the balloon and a leg bag was attached for drainage.  Patient tolerated well.     Performed by: Debroah Loop, PA-C   Follow up: Return in about 4 weeks (around 06/17/2020) for SPT exchange.

## 2020-06-17 ENCOUNTER — Encounter: Payer: Self-pay | Admitting: Physician Assistant

## 2020-06-17 ENCOUNTER — Ambulatory Visit: Payer: Medicare Other | Admitting: Physician Assistant

## 2020-06-17 ENCOUNTER — Other Ambulatory Visit: Payer: Self-pay

## 2020-06-17 VITALS — BP 183/88 | HR 114 | Ht 70.0 in | Wt 170.0 lb

## 2020-06-17 DIAGNOSIS — Z435 Encounter for attention to cystostomy: Secondary | ICD-10-CM

## 2020-06-17 NOTE — Progress Notes (Signed)
Suprapubic Cath Change  Patient is present today for a suprapubic catheter change due to urinary retention.  41ml of water was drained from the balloon, a 16FR foley cath was removed from the tract without difficulty.  Site was cleaned and prepped in a sterile fashion with betadine.  A 16FR foley cath was replaced into the tract no complications were noted. Urine return was noted, 10 ml of sterile water was inflated into the balloon and a leg bag was attached for drainage.  Patient tolerated well.    Performed by: Debroah Loop, PA-C   Additional notes: Stopped Flomax today.  Follow up: Return in about 4 weeks (around 07/15/2020) for SPT exchange.

## 2020-07-18 ENCOUNTER — Ambulatory Visit: Payer: Medicare Other | Admitting: Physician Assistant

## 2020-07-18 ENCOUNTER — Other Ambulatory Visit: Payer: Self-pay

## 2020-07-18 DIAGNOSIS — Z435 Encounter for attention to cystostomy: Secondary | ICD-10-CM

## 2020-07-18 DIAGNOSIS — N3289 Other specified disorders of bladder: Secondary | ICD-10-CM

## 2020-07-18 MED ORDER — MIRABEGRON ER 25 MG PO TB24: 25.0000 mg | ORAL_TABLET | Freq: Every day | ORAL | 0 refills | Status: DC

## 2020-07-18 NOTE — Progress Notes (Signed)
Suprapubic Cath Change  Patient is present today for a suprapubic catheter change due to urinary retention.  101ml of water was drained from the balloon, a 16FR foley cath was removed from the tract without difficulty.  Site was cleaned and prepped in a sterile fashion with betadine.  A 16FR foley cath was replaced into the tract no complications were noted. Urine return was not noted but placement was confirmed with easy advancement of the catheter and resistance with retraction of the catheter at the level of the abdominal wall, 10 ml of sterile water was inflated into the balloon and a leg bag was attached for drainage.  Patient tolerated well.   Performed by: Debroah Loop, PA-C   Additional notes: Patient reports occasional urinary urgency since discontinuing Flomax. He denies lower abdominal pain, low back pain, fever, chills, nausea, or vomiting. No erythema noted at the SPT site. Starting Myrbetriq 25mg  daily for management of bladder spasms; samples provided today.  Follow up: Return in about 4 weeks (around 08/15/2020) for SPT exchange.

## 2020-08-16 ENCOUNTER — Encounter: Payer: Self-pay | Admitting: Physician Assistant

## 2020-08-16 ENCOUNTER — Other Ambulatory Visit: Payer: Self-pay

## 2020-08-16 ENCOUNTER — Ambulatory Visit: Payer: Medicare Other | Admitting: Physician Assistant

## 2020-08-16 VITALS — BP 124/78 | HR 118 | Ht 70.0 in | Wt 170.0 lb

## 2020-08-16 DIAGNOSIS — Z435 Encounter for attention to cystostomy: Secondary | ICD-10-CM

## 2020-08-16 NOTE — Progress Notes (Signed)
Suprapubic Cath Change  Patient is present today for a suprapubic catheter change due to urinary retention.  20m of water was drained from the balloon, a 16FR foley cath was removed from the tract without difficulty.  Site was cleaned and prepped in a sterile fashion with betadine.  A 16FR foley cath was replaced into the tract no complications were noted. Urine return was not noted but placement felt appropriate with resistance met on retraction at the level of the abdominal wall, 10 ml of sterile water was inflated into the balloon and a leg bag was attached for drainage.  Patient tolerated well.     Performed by: SDebroah Loop PA-C   Follow up: Return in about 4 weeks (around 09/13/2020) for SPT exchange.

## 2020-09-05 ENCOUNTER — Other Ambulatory Visit: Payer: Self-pay | Admitting: Otolaryngology

## 2020-09-07 LAB — SURGICAL PATHOLOGY

## 2020-09-14 ENCOUNTER — Ambulatory Visit: Payer: Medicare Other | Admitting: Physician Assistant

## 2020-09-14 ENCOUNTER — Other Ambulatory Visit: Payer: Self-pay

## 2020-09-14 DIAGNOSIS — Z435 Encounter for attention to cystostomy: Secondary | ICD-10-CM

## 2020-09-14 NOTE — Progress Notes (Signed)
Suprapubic Cath Change  Patient is present today for a suprapubic catheter change due to urinary retention.  55ml of water was drained from the balloon, a 16FR foley cath was removed from the tract with out difficulty.  Site was cleaned and prepped in a sterile fashion with betadine.  A 16FR foley cath was replaced into the tract no complications were noted. Urine return was noted, 10 ml of sterile water was inflated into the balloon and a leg bag was attached for drainage.  Patient tolerated well.   Performed by: Debroah Loop, PA-C   Follow up: Return in about 4 weeks (around 10/12/2020) for SPT exchange.

## 2020-10-18 ENCOUNTER — Ambulatory Visit: Payer: Self-pay | Admitting: Physician Assistant

## 2020-10-20 ENCOUNTER — Other Ambulatory Visit: Payer: Self-pay

## 2020-10-20 ENCOUNTER — Ambulatory Visit: Payer: Medicare Other | Admitting: Physician Assistant

## 2020-10-20 DIAGNOSIS — N390 Urinary tract infection, site not specified: Secondary | ICD-10-CM

## 2020-10-20 DIAGNOSIS — T83518A Infection and inflammatory reaction due to other urinary catheter, initial encounter: Secondary | ICD-10-CM | POA: Diagnosis not present

## 2020-10-20 DIAGNOSIS — Z435 Encounter for attention to cystostomy: Secondary | ICD-10-CM | POA: Diagnosis not present

## 2020-10-20 NOTE — Progress Notes (Addendum)
Suprapubic Cath Change  Patient is present today for a suprapubic catheter change due to urinary retention.  37ml of water was drained from the balloon, a 16FR foley cath was removed from the tract without difficulty.  Site was cleaned and prepped in a sterile fashion with betadine.  A 16FR foley cath was replaced into the tract no complications were noted. Urine return was not noted but appropriate placement was confirmed with easy advance of the catheter and resistance with retraction consistent with hitting the abdominal wall, 10 ml of sterile water was inflated into the balloon and a leg bag was attached for drainage.  Patient tolerated well.    Performed by: Debroah Loop, PA-C   Additional notes: Purple bag noted today; patient denies lower abdominal pain, low back pain, fever, chills, nausea, and vomiting. Counseled patient this is a benign finding not warranting treatment and he expressed understanding.  Follow up: Return in about 4 weeks (around 11/17/2020) for SPT exchange.

## 2020-11-18 ENCOUNTER — Ambulatory Visit: Payer: Medicare Other | Admitting: Physician Assistant

## 2020-11-18 ENCOUNTER — Other Ambulatory Visit: Payer: Self-pay

## 2020-11-18 DIAGNOSIS — Z435 Encounter for attention to cystostomy: Secondary | ICD-10-CM

## 2020-11-18 NOTE — Progress Notes (Signed)
Suprapubic Cath Change  Patient is present today for a suprapubic catheter change due to urinary retention.  83m of water was drained from the balloon, a 16FR foley cath was removed from the tract with out difficulty.  Site was cleaned and prepped in a sterile fashion with betadine.  A 16FR foley cath was replaced into the tract no complications were noted. Urine return was not noted but catheter was easily advanced and resistance was met with retraction consistent with appropriate placement within the bladder lumen, 10 ml of sterile water was inflated into the balloon and a leg bag was attached for drainage.  Patient tolerated well.    Performed by: SDebroah Loop PA-C   Follow up: Return in about 4 weeks (around 12/16/2020) for SPT exchange.

## 2020-12-16 ENCOUNTER — Ambulatory Visit: Payer: Self-pay | Admitting: Physician Assistant

## 2020-12-23 ENCOUNTER — Other Ambulatory Visit: Payer: Self-pay

## 2020-12-23 ENCOUNTER — Ambulatory Visit: Payer: Medicare Other | Admitting: Physician Assistant

## 2020-12-23 DIAGNOSIS — R338 Other retention of urine: Secondary | ICD-10-CM

## 2020-12-23 DIAGNOSIS — Z435 Encounter for attention to cystostomy: Secondary | ICD-10-CM | POA: Diagnosis not present

## 2020-12-23 MED ORDER — AMOXICILLIN-POT CLAVULANATE 875-125 MG PO TABS
1.0000 | ORAL_TABLET | Freq: Two times a day (BID) | ORAL | 0 refills | Status: AC
Start: 2020-12-23 — End: 2020-12-26

## 2020-12-23 NOTE — Progress Notes (Signed)
Suprapubic Cath Change  Patient is present today for a suprapubic catheter change due to urinary retention.  42m of water was drained from the balloon, a 16FR foley cath was removed from the tract without difficulty.  Site was cleaned and prepped in a sterile fashion with betadine.  A 16FR foley cath was replaced into the tract no complications were noted. Urine return was noted, 10 ml of sterile water was inflated into the balloon and a leg bag was attached for drainage.  Patient tolerated well.   Performed by: SDebroah Loop PA-C   Additional notes: Patient reports no urinary drainage from his catheter since last night. He is having some bladder spasms this morning. 2262mclear yellow urine efflux with Foley exchange this morning consistent with occluded catheter. I obtained a urine sample from his new catheter for culture and am treating him with Augmentin x3 days for UTI prevention. If he develops problems with recurrent catheter obstruction, may consider upsizing his catheter vs vinegar or Renacidin instillations.  Follow up: Return in about 4 weeks (around 01/20/2021) for SPT exchange.   I spent 12 minutes on the day of the encounter to include pre-visit record review, face-to-face time with the patient, and post-visit ordering of tests.

## 2020-12-23 NOTE — Patient Instructions (Signed)
Please start your Augmentin antibiotic as soon as you get it, hopefully this morning. I don't think you have an infection right now, but I'd like you to take antibiotics for 3 days to prevent an infection given that your bladder was rather full today.  If you are still having bladder spasms early next week, please call me and we can start you on a medication for these.

## 2021-01-03 LAB — CULTURE, URINE COMPREHENSIVE

## 2021-01-04 ENCOUNTER — Telehealth: Payer: Self-pay

## 2021-01-04 NOTE — Telephone Encounter (Signed)
Patient reports he is doing well at this time, no signs or symptoms of infection.

## 2021-01-04 NOTE — Telephone Encounter (Signed)
Positive Ucx represents asymptomatic bacteriuria in the setting of chronic indwelling catheter. No further abx indicated.

## 2021-01-04 NOTE — Telephone Encounter (Signed)
-----   Message from [place], [place] sent at 01/03/2021  4:46 PM EDT ----- Please contact him and see if he's having any infective symptoms: low belly pain, low back pain, fever, chills, nausea, vomiting. ----- Message ----- From: Interface, [facility] Lab Results In Sent: 12/28/2020   7:37 AM EDT To: Debroah Loop, PA-C

## 2021-01-20 ENCOUNTER — Other Ambulatory Visit: Payer: Self-pay

## 2021-01-20 ENCOUNTER — Ambulatory Visit: Payer: Medicare Other | Admitting: Physician Assistant

## 2021-01-20 DIAGNOSIS — Z435 Encounter for attention to cystostomy: Secondary | ICD-10-CM | POA: Diagnosis not present

## 2021-01-20 DIAGNOSIS — R339 Retention of urine, unspecified: Secondary | ICD-10-CM | POA: Diagnosis not present

## 2021-01-20 NOTE — Progress Notes (Signed)
Suprapubic Cath Change  Patient is present today for a suprapubic catheter change due to urinary retention.  63ml of water was drained from the balloon, a 16FR foley cath was removed from the tract without difficulty.  Site was cleaned and prepped in a sterile fashion with betadine.  A 16FR foley cath was replaced into the tract no complications were noted. Urine return was noted, 10 ml of sterile water was inflated into the balloon and a leg bag was attached for drainage.  Patient tolerated well.    Performed by: Debroah Loop, PA-C   Follow up: Return in about 4 weeks (around 02/17/2021) for SPT exchange.

## 2021-02-17 ENCOUNTER — Other Ambulatory Visit: Payer: Self-pay

## 2021-02-17 ENCOUNTER — Ambulatory Visit: Payer: Medicare Other | Admitting: Physician Assistant

## 2021-02-17 DIAGNOSIS — Z435 Encounter for attention to cystostomy: Secondary | ICD-10-CM | POA: Diagnosis not present

## 2021-02-17 NOTE — Progress Notes (Signed)
Suprapubic Cath Change  Patient is present today for a suprapubic catheter change due to urinary retention.  33ml of water was drained from the balloon, a 16FR foley cath was removed from the tract without difficulty.  Site was cleaned and prepped in a sterile fashion with betadine.  A 16FR foley cath was replaced into the tract no complications were noted. Urine return was not noted but placement was felt to be appropriate, 10 ml of sterile water was inflated into the balloon and a leg bag was attached for drainage.  Patient tolerated well.     Performed by: Debroah Loop, PA-C   Follow up: Return in about 4 weeks (around 03/17/2021) for SPT exchange.

## 2021-03-17 ENCOUNTER — Other Ambulatory Visit: Payer: Self-pay

## 2021-03-17 ENCOUNTER — Ambulatory Visit: Payer: Medicare Other | Admitting: Physician Assistant

## 2021-03-17 DIAGNOSIS — Z435 Encounter for attention to cystostomy: Secondary | ICD-10-CM | POA: Diagnosis not present

## 2021-03-17 DIAGNOSIS — R339 Retention of urine, unspecified: Secondary | ICD-10-CM | POA: Diagnosis not present

## 2021-03-17 NOTE — Progress Notes (Signed)
Suprapubic Cath Change  Patient is present today for a suprapubic catheter change due to urinary retention.  34ml of water was drained from the balloon, a 16FR foley cath was removed from the tract with out difficulty.  Site was cleaned and prepped in a sterile fashion with betadine.  A 16FR foley cath was replaced into the tract no complications were noted. Urine return was not noted but placement was felt to be appropriate, 10 ml of sterile water was inflated into the balloon and a leg bag was attached for drainage.  Patient tolerated well.    Performed by: Debroah Loop, PA-C   Follow up: Return in about 4 weeks (around 04/14/2021) for SPT exchange.

## 2021-04-05 ENCOUNTER — Other Ambulatory Visit: Payer: Self-pay

## 2021-04-05 ENCOUNTER — Encounter: Payer: Self-pay | Admitting: Emergency Medicine

## 2021-04-05 ENCOUNTER — Emergency Department
Admission: EM | Admit: 2021-04-05 | Discharge: 2021-04-05 | Disposition: A | Discharge status: Home / Self Care | Payer: Medicare Other | Attending: Emergency Medicine | Admitting: Emergency Medicine

## 2021-04-05 DIAGNOSIS — Z85828 Personal history of other malignant neoplasm of skin: Secondary | ICD-10-CM | POA: Diagnosis not present

## 2021-04-05 DIAGNOSIS — E039 Hypothyroidism, unspecified: Secondary | ICD-10-CM | POA: Insufficient documentation

## 2021-04-05 DIAGNOSIS — T83198A Other mechanical complication of other urinary devices and implants, initial encounter: Secondary | ICD-10-CM | POA: Insufficient documentation

## 2021-04-05 DIAGNOSIS — R339 Retention of urine, unspecified: Secondary | ICD-10-CM | POA: Diagnosis not present

## 2021-04-05 DIAGNOSIS — N390 Urinary tract infection, site not specified: Secondary | ICD-10-CM

## 2021-04-05 DIAGNOSIS — T83090A Other mechanical complication of cystostomy catheter, initial encounter: Secondary | ICD-10-CM

## 2021-04-05 DIAGNOSIS — I10 Essential (primary) hypertension: Secondary | ICD-10-CM | POA: Diagnosis not present

## 2021-04-05 DIAGNOSIS — Z87891 Personal history of nicotine dependence: Secondary | ICD-10-CM | POA: Insufficient documentation

## 2021-04-05 DIAGNOSIS — Z79899 Other long term (current) drug therapy: Secondary | ICD-10-CM | POA: Insufficient documentation

## 2021-04-05 DIAGNOSIS — R338 Other retention of urine: Secondary | ICD-10-CM

## 2021-04-05 LAB — URINALYSIS, COMPLETE (UACMP) WITH MICROSCOPIC
Bilirubin Urine: NEGATIVE
Glucose, UA: NEGATIVE mg/dL
Nitrite: POSITIVE — AB
Protein, ur: 100 mg/dL — AB
RBC / HPF: >50 RBC/hpf (ref 0–5)
Specific Gravity, Urine: 1.015 (ref 1.005–1.030)
WBC, UA: >50 WBC/hpf (ref 0–5)
pH: >9 — ABNORMAL HIGH (ref 5.0–8.0)

## 2021-04-05 MED ORDER — NITROFURANTOIN MONOHYD MACRO 100 MG PO CAPS
100.0000 mg | ORAL_CAPSULE | Freq: Once | ORAL | Status: AC
  Administered 2021-04-05: 03:00:00 100 mg via ORAL
  Filled 2021-04-05: qty 1

## 2021-04-05 MED ORDER — NITROFURANTOIN MONOHYD MACRO 100 MG PO CAPS: 100.0000 mg | ORAL_CAPSULE | Freq: Two times a day (BID) | ORAL | 0 refills | Status: AC

## 2021-04-05 NOTE — ED Provider Notes (Signed)
[facility] Emergency Department Provider Note  ____________________________________________   Event Date/Time   First MD Initiated Contact with Patient 04/05/21 0111     (approximate)  I have reviewed the triage vital signs and the nursing notes.   HISTORY  Chief Complaint Urinary Retention    HPI Nicholas Grimes is a 87 y.o. male with history of hypertension, hypothyroidism, urinary retention status post suprapubic catheter that was placed about a year ago who is followed by [facility] urology who presents to the emergency department with urinary retention.  States his catheter was last exchanged at the urologist office on 03/17/2021.  He has not been able to get urine since about 3 PM today and is now having severe discomfort in the lower abdomen.  No fevers, vomiting or diarrhea.  He has not tried to flush his catheter at home.        Past Medical History:  Diagnosis Date   Arthritis    Cancer ([place])    Basal Cell Skin Cancer   HOH (hard of hearing)    Hypertension    Hypothyroidism    Thyroid disease     Patient Active Problem List   Diagnosis Date Noted   Debility 09/14/2019   Hypotension 09/13/2019   Urinary retention 12/09/2017   Anemia 12/01/2017   Hyponatremia 11/30/2017   Hematuria 06/25/2016   Hypercalcemia 06/25/2016   Hypothyroidism due to acquired atrophy of thyroid 05/11/2015   History of urethral stricture 03/03/2015   Hyperlipidemia 11/05/2013   Hypertension 11/05/2013   Renal insufficiency 11/05/2013   Subclinical hypothyroidism 11/05/2013   BPH (benign prostatic hyperplasia) 05/01/2012    Past Surgical History:  Procedure Laterality Date   CHOLECYSTECTOMY     CYSTOSCOPY WITH FULGERATION N/A 09/13/2019   Procedure: CYSTOSCOPY WITH FULGERATIO;  Surgeon: Irine Seal, MD;  Location: [facility] ORS;  Service: Urology;  Laterality: N/A;   CYSTOSCOPY WITH URETHRAL DILATATION N/A 07/16/2019   Procedure: CYSTOSCOPY WITH URETHRAL  DILATATION;  Surgeon: Abbie Sons, MD;  Location: [facility] ORS;  Service: Urology;  Laterality: N/A;   INGUINAL HERNIA REPAIR Right 10/30/2016   Procedure: HERNIA REPAIR INGUINAL ADULT;  Surgeon: Leonie Green, MD;  Location: [facility] ORS;  Service: General;  Laterality: Right;   INSERTION OF MESH  10/30/2016   Procedure: INSERTION OF MESH;  Surgeon: Leonie Green, MD;  Location: [facility] ORS;  Service: General;;   IR CATHETER TUBE CHANGE  10/23/2019   IR CATHETER TUBE CHANGE  11/20/2019   PROSTATE SURGERY      Prior to Admission medications   Medication Sig Start Date End Date Taking? Authorizing Provider  ALPRAZolam (XANAX) 0.25 MG tablet Take 0.25 mg by mouth 2 (two) times daily as needed. 07/19/20  Yes [provider]  doxazosin (CARDURA) 8 MG tablet Take 8 mg by mouth daily. 10/22/19  Yes [provider]  hydrochlorothiazide (HYDRODIURIL) 25 MG tablet Take 25 mg by mouth daily.  01/09/18  Yes [provider]  levothyroxine (SYNTHROID, LEVOTHROID) 75 MCG tablet Take 75 mcg by mouth daily before breakfast.    Yes [provider]  Multiple Vitamin (MULTIVITAMIN WITH MINERALS) TABS tablet Take 1 tablet by mouth daily at 12 noon. Cemtrum Silver   Yes [provider]  nitrofurantoin, macrocrystal-monohydrate, (MACROBID) 100 MG capsule Take 1 capsule (100 mg total) by mouth 2 (two) times daily for 7 days. 04/05/21 04/12/21 Yes Jayquon Theiler, Delice Bison, DO  simvastatin (ZOCOR) 20 MG tablet Take 20 mg by mouth at bedtime.  Yes [provider]  finasteride (PROSCAR) 5 MG tablet Take 5 mg by mouth daily. Patient not taking: Reported on 04/05/2021    [provider]  mirabegron ER (MYRBETRIQ) 25 MG TB24 tablet Take 25 mg by mouth daily. Patient not taking: Reported on 04/05/2021    [provider]    Allergies Patient has no known allergies.  Family History  Problem Relation Age of Onset   Diabetes Mother    CAD Father     Social  History Social History   Tobacco Use   Smoking status: Former    Packs/day: 0.00    Types: Cigarettes, Cigars   Smokeless tobacco: Former  Scientific laboratory technician Use: Never used  Substance Use Topics   Alcohol use: No   Drug use: No    Review of Systems Constitutional: No fever. Eyes: No visual changes. ENT: No sore throat. Cardiovascular: Denies chest pain. Respiratory: Denies shortness of breath. Gastrointestinal: No nausea, vomiting, diarrhea. Genitourinary: Negative for dysuria. Musculoskeletal: Negative for back pain. Skin: Negative for rash. Neurological: Negative for focal weakness or numbness.  ____________________________________________   PHYSICAL EXAM:  VITAL SIGNS: ED Triage Vitals  Enc Vitals Group     BP 04/05/21 0109 (!) 173/78     Pulse Rate 04/05/21 0109 73     Resp 04/05/21 0109 20     Temp 04/05/21 0109 97.8 F (36.6 C)     Temp Source 04/05/21 0109 Oral     SpO2 04/05/21 0109 98 %     Weight 04/05/21 0105 170 lb (77.1 kg)     Height 04/05/21 0105 6' (1.829 m)     Head Circumference --      Peak Flow --      Pain Score 04/05/21 0105 10     Pain Loc --      Pain Edu? --      Excl. in [place]? --    CONSTITUTIONAL: Alert and oriented and responds appropriately to questions.  Elderly, appears uncomfortable and restless HEAD: Normocephalic EYES: Conjunctivae clear, pupils appear equal, EOM appear intact ENT: normal nose; moist mucous membranes NECK: Supple, normal ROM CARD: RRR; S1 and S2 appreciated; no murmurs, no clicks, no rubs, no gallops RESP: Normal chest excursion without splinting or tachypnea; breath sounds clear and equal bilaterally; no wheezes, no rhonchi, no rales, no hypoxia or respiratory distress, speaking full sentences ABD/GI: Normal bowel sounds; minimally distended, slightly tender over the suprapubic area, suprapubic catheter in place without surrounding redness, warmth or drainage.  No rebound, no guarding, no peritoneal signs,  no hepatosplenomegaly BACK: The back appears normal EXT: Normal ROM in all joints; no deformity noted, no edema; no cyanosis SKIN: Normal color for age and race; warm; no rash on exposed skin NEURO: Moves all extremities equally PSYCH: The patient's mood and manner are appropriate.  ____________________________________________   LABS (all labs ordered are listed, but only abnormal results are displayed)  Labs Reviewed  URINALYSIS, COMPLETE (UACMP) WITH MICROSCOPIC - Abnormal; Notable for the following components:      Result Value   Color, Urine AMBER (*)    APPearance TURBID (*)    pH >9.0 (*)    Hgb urine dipstick LARGE (*)    Ketones, ur TRACE (*)    Protein, ur 100 (*)    Nitrite POSITIVE (*)    Leukocytes,Ua LARGE (*)    Bacteria, UA MANY (*)    All other components within normal limits  URINE CULTURE  ____________________________________________  EKG   ____________________________________________  [place], personally viewed and evaluated these images (plain radiographs) as part of my medical decision making, as well as reviewing the written report by the radiologist.  ED MD interpretation:    Official radiology report(s): No results found.  ____________________________________________   PROCEDURES  Procedure(s) performed (including Critical Care):  SUPRAPUBIC TUBE PLACEMENT  Date/Time: 04/05/2021 2:33 AM Performed by: Winford Hehn, Delice Bison, DO Authorized by: Alcario Tinkey, Delice Bison, DO   Consent:    Consent obtained:  Verbal   Consent given by:  Patient   Risks, benefits, and alternatives were discussed: yes     Risks discussed:  Bleeding, infection and pain   Alternatives discussed:  Alternative treatment Universal protocol:    Procedure explained and questions answered to patient or proxy's satisfaction: yes     Relevant documents present and verified: yes     Test results available: yes     Imaging studies available: yes     Required  blood products, implants, devices, and special equipment available: yes     Site/side marked: yes     Immediately prior to procedure, a time out was called: yes     Patient identity confirmed:  Verbally with patient Sedation:    Sedation type:  None Anesthesia:    Anesthesia method:  None Procedure details:    Complexity:  Simple   Catheter type:  Foley   Catheter size:  16 Fr   Ultrasound guidance: no     Number of attempts:  1   Urine characteristics:  Cloudy and yellow Post-procedure details:    Procedure completion:  Tolerated well, no immediate complications   ____________________________________________   INITIAL IMPRESSION / ASSESSMENT AND PLAN / ED COURSE  As part of my medical decision making, I reviewed the following data within the [place] notes reviewed and incorporated, Labs reviewed , Old chart reviewed, and Notes from prior ED visits         Patient here with urinary retention.  Attempted to flush his suprapubic catheter without any relief.  Very difficult to flush the catheter and we are getting some return of very cloudy, yellow urine with a lot of sediment.  Decision made to exchange his suprapubic catheter.  He has a 39 [place] in place.  I have exchanged with another 22 [place] and he then began draining a large amount of urine and had immediate relief.  Urinalysis today appears infected but could also be contaminated.  Urine culture is pending.  It appears his previous urine cultures have grown Enterococcus sensitive to Macrobid.  Will discharge on Macrobid and recommended close follow-up with his PCP and urologist.  At this time, I do not feel there is any life-threatening condition present. I have reviewed, interpreted and discussed all results (EKG, imaging, lab, urine as appropriate) and exam findings with patient/family. I have reviewed nursing notes and appropriate previous records.  I feel the patient is safe to be discharged home  without further emergent workup and can continue workup as an outpatient as needed. Discussed usual and customary return precautions. Patient/family verbalize understanding and are comfortable with this plan.  Outpatient follow-up has been provided as needed. All questions have been answered.  ____________________________________________   FINAL CLINICAL IMPRESSION(S) / ED DIAGNOSES  Final diagnoses:  Obstruction of suprapubic catheter, initial encounter [facility])  Acute urinary retention  Acute UTI     ED Discharge Orders  Ordered    nitrofurantoin, macrocrystal-monohydrate, (MACROBID) 100 MG capsule  2 times daily        04/05/21 0229            *Please note:  DON TIU was evaluated in Emergency Department on 04/05/2021 for the symptoms described in the history of present illness. He was evaluated in the context of the global COVID-19 pandemic, which necessitated consideration that the patient might be at risk for infection with the SARS-CoV-2 virus that causes COVID-19. Institutional protocols and algorithms that pertain to the evaluation of patients at risk for COVID-19 are in a state of rapid change based on information released by regulatory bodies including the CDC and federal and state organizations. These policies and algorithms were followed during the patient's care in the ED.  Some ED evaluations and interventions may be delayed as a result of limited staffing during and the pandemic.*   Note:  This document was prepared using Dragon voice recognition software and may include unintentional dictation errors.    Cypress Hinkson, Delice Bison, DO 04/05/21 [phone]

## 2021-04-05 NOTE — ED Triage Notes (Signed)
Pt to triage via w/c, appears uncomfortable, moaning; Pt reports suprapubic foley cath not draining since 3pm (has been in place x yr, last changed 2wks ago at [facility] urology

## 2021-04-07 LAB — URINE CULTURE: Culture: >=100000 — AB

## 2021-04-08 NOTE — Progress Notes (Signed)
ED Antimicrobial Stewardship Positive Culture Follow Up   Nicholas Grimes is an 87 y.o. male who presented to [facility] on 04/05/2021 with a chief complaint of  Chief Complaint  Patient presents with   Urinary Retention    Recent Results (from the past 720 hour(s))  Urine Culture     Status: Abnormal   Collection Time: 04/05/21  1:48 AM   Specimen: Urine, Random  Result Value Ref Range Status   Specimen Description   Final    URINE, RANDOM Performed at [facility], [address], [place], [place] [address]    Special Requests   Final    NONE Performed at [facility], [address] [place], [place] [address]    Culture >=100,000 COLONIES/mL PROVIDENCIA RETTGERI (A)  Final   Report Status 04/07/2021 FINAL  Final   Organism ID, Bacteria PROVIDENCIA RETTGERI (A)  Final      Susceptibility   Providencia rettgeri - MIC*    AMPICILLIN >=32 RESISTANT Resistant     CEFAZOLIN RESISTANT Resistant     CEFEPIME <=0.12 SENSITIVE Sensitive     CEFTRIAXONE <=0.25 SENSITIVE Sensitive     CIPROFLOXACIN 2 RESISTANT Resistant     GENTAMICIN <=1 SENSITIVE Sensitive     IMIPENEM <=0.25 SENSITIVE Sensitive     NITROFURANTOIN 128 RESISTANT Resistant     TRIMETH/SULFA <=20 SENSITIVE Sensitive     AMPICILLIN/SULBACTAM <=2 SENSITIVE Sensitive     PIP/TAZO <=4 SENSITIVE Sensitive     * >=100,000 COLONIES/mL PROVIDENCIA RETTGERI   Obstructed foley cath- was replaced in ED 04/05/21  [x]  Treated with nitrofurantoin, organism resistant to prescribed antimicrobial  New antibiotic prescription: Cefpodoxime 200mg  PO BID x 10 days  ED Provider: Marjean Donna  12/24:  Called pt at [phone]. Pt's preferred pharmacy is closed today ([place] [phone] his 1st choice of [place] store on [address] is also closed.  Called around and the [facility] on [address] is open till 5:00pm.  Called in Prescription to this store.  Discussed with  patient to stop the nitrofurantoin and start the new antibiotic and suggested to contact his urology MD to update them.   Monroe Toure A 04/08/2021, 2:24 PM Clinical Pharmacist

## 2021-04-14 ENCOUNTER — Telehealth: Payer: Self-pay

## 2021-04-14 NOTE — Telephone Encounter (Signed)
Debroah Loop, PA-C to  Evelina Bucy, CMA Can you please call him and see if he's feeling okay? He was prescribed Macrobid in the ED but culture is resistant. I suspect this reflects colonization rather than infection and does not require treatment, but if he's symptomatic please start him on Bactrim DS BID x7 days. Let's have him keep his cath change appointment with me next week.   Attempted to reach patient, [facility] for him to return call. See above message.

## 2021-04-18 ENCOUNTER — Ambulatory Visit: Payer: Medicare Other | Admitting: Physician Assistant

## 2021-04-18 ENCOUNTER — Other Ambulatory Visit: Payer: Self-pay

## 2021-04-18 DIAGNOSIS — Z435 Encounter for attention to cystostomy: Secondary | ICD-10-CM

## 2021-04-18 NOTE — Progress Notes (Signed)
Suprapubic Cath Change  Patient is present today for a suprapubic catheter change due to urinary retention.  37ml of water was drained from the balloon, a 16FR foley cath was removed from the tract with out difficulty.  Site was cleaned and prepped in a sterile fashion with betadine.  A 16FR Silastic foley cath was replaced into the tract no complications were noted. Urine return was not noted, 12 ml of sterile water was inflated into the balloon and a leg bag was attached for drainage.  Patient tolerated well.    Performed by: Debroah Loop, PA-C   Additional notes: Patient had catheter obstruction requiring ED visit on 04/05/2021. Switched him to Silastic catheter today to attempt to reduce risk of repeat occurrence. Abx have been switched from Macrobid to cefpodoxime per patient report; clinically not infected today.  Follow up: Return in about 4 weeks (around 05/16/2021) for SPT exchange.

## 2021-04-18 NOTE — Telephone Encounter (Signed)
Patient seen in clinic 04/18/21.

## 2021-05-15 NOTE — Progress Notes (Signed)
Suprapubic Cath Change  Patient is present today for a suprapubic catheter change due to urinary retention.  9 ml of water was drained from the balloon, a 16 FR Silastic foley cath was removed from the tract with out difficulty.  Site was cleaned and prepped in a sterile fashion with betadine.  A 16 FR Silastic foley cath was replaced into the tract no complications were noted. Urine return was noted, 10 ml of sterile water was inflated into the balloon and a leg bag was attached for drainage.  Patient tolerated well.   Performed by: Zara Council, PA-C   Follow up: One month for SPT exchange

## 2021-05-16 ENCOUNTER — Other Ambulatory Visit: Payer: Self-pay

## 2021-05-16 ENCOUNTER — Ambulatory Visit: Payer: Medicare Other | Admitting: Urology

## 2021-05-16 DIAGNOSIS — Z435 Encounter for attention to cystostomy: Secondary | ICD-10-CM

## 2021-06-15 NOTE — Progress Notes (Signed)
Suprapubic Cath Change ? ?Patient is present today for a suprapubic catheter change due to urinary retention.  8 ml of water was drained from the balloon, a 16 FR Silastic foley cath was removed from the tract with out difficulty.  Site was cleaned and prepped in a sterile fashion with betadine.  A 18 FR Silastic foley cath was replaced into the tract no complications were noted. Urine return was noted, 10 ml of sterile water was inflated into the balloon and a leg bag was attached for drainage.  Patient tolerated well.  ? ?Performed by: Zara Council, PA-C  ? ?Follow up: Return in about 1 month (around 07/17/2021) for SPT exchange . ? ? ?

## 2021-06-16 ENCOUNTER — Other Ambulatory Visit: Payer: Self-pay

## 2021-06-16 ENCOUNTER — Ambulatory Visit: Payer: Medicare Other | Admitting: Urology

## 2021-06-16 DIAGNOSIS — Z435 Encounter for attention to cystostomy: Secondary | ICD-10-CM | POA: Diagnosis not present

## 2021-06-16 DIAGNOSIS — R339 Retention of urine, unspecified: Secondary | ICD-10-CM | POA: Diagnosis not present

## 2021-07-13 NOTE — Progress Notes (Signed)
Suprapubic Cath Change ? ?Patient is present today for a suprapubic catheter change due to urinary retention.  8 ml of water was drained from the balloon, a 18 FR Silastic foley cath was removed from the tract with out difficulty.  Site was cleaned and prepped in a sterile fashion with betadine.  A 18 FR foley cath was replaced into the tract no complications were noted. Urine return was noted, 10 ml of sterile water was inflated into the balloon and a leg bag was attached for drainage.  Patient tolerated well.  ? ?Performed by: Zara Council, PA-C ? ?Follow up Return In about 1 month (around 07/15/2021) ? ? ?I,Kailey Littlejohn,acting as a Education administrator for [employer], PA-C.,have documented all relevant documentation on the behalf of [place], PA-C,as directed by  [facility], PA-C while in the presence of [place], PA-C. ? ?I have reviewed the above documentation for accuracy and completeness, and I agree with the above.   ? ?Zara Council, PA-C  ?

## 2021-07-14 ENCOUNTER — Ambulatory Visit: Payer: Medicare Other | Admitting: Urology

## 2021-07-14 DIAGNOSIS — R339 Retention of urine, unspecified: Secondary | ICD-10-CM

## 2021-07-14 DIAGNOSIS — Z435 Encounter for attention to cystostomy: Secondary | ICD-10-CM

## 2021-08-11 ENCOUNTER — Ambulatory Visit: Payer: Medicare Other | Admitting: Physician Assistant

## 2021-08-11 ENCOUNTER — Encounter: Payer: Self-pay | Admitting: Physician Assistant

## 2021-08-11 DIAGNOSIS — R339 Retention of urine, unspecified: Secondary | ICD-10-CM

## 2021-08-11 DIAGNOSIS — Z435 Encounter for attention to cystostomy: Secondary | ICD-10-CM | POA: Diagnosis not present

## 2021-08-11 NOTE — Progress Notes (Signed)
Suprapubic Cath Change ? ?Patient is present today for a suprapubic catheter change due to urinary retention.  4m of water was drained from the balloon, a 18FR foley cath was removed from the tract with out difficulty.  Site was cleaned and prepped in a sterile fashion with betadine.  A 18FR Silastic foley cath was replaced into the tract no complications were noted. Urine return was noted, 10 ml of sterile water was inflated into the balloon and a leg bag was attached for drainage.  Patient tolerated well.   ? ?Performed by: SDebroah Loop PA-C  ? ?Follow up: Return in about 4 weeks (around 09/08/2021) for SPT exchange.   ?

## 2021-09-12 ENCOUNTER — Ambulatory Visit: Payer: Medicare Other | Admitting: Physician Assistant

## 2021-09-12 DIAGNOSIS — Z435 Encounter for attention to cystostomy: Secondary | ICD-10-CM | POA: Diagnosis not present

## 2021-09-12 DIAGNOSIS — R339 Retention of urine, unspecified: Secondary | ICD-10-CM | POA: Diagnosis not present

## 2021-09-12 NOTE — Progress Notes (Signed)
Suprapubic Cath Change  Patient is present today for a suprapubic catheter change due to urinary retention.  44m of water was drained from the balloon, a 18FR Silastic foley cath was removed from the tract without difficulty.  Site was cleaned and prepped in a sterile fashion with betadine.  A 16FR Silastic foley cath was replaced into the tract no complications were noted. Urine return was noted, 10 ml of sterile water was inflated into the balloon and a leg bag was attached for drainage.  Patient tolerated well.     Performed by: SDebroah Loop PA-C   Follow up: Return in about 4 weeks (around 10/10/2021) for SPT exchange.

## 2021-10-11 ENCOUNTER — Ambulatory Visit: Payer: Medicare Other | Admitting: Physician Assistant

## 2021-10-11 DIAGNOSIS — R339 Retention of urine, unspecified: Secondary | ICD-10-CM

## 2021-10-11 DIAGNOSIS — Z435 Encounter for attention to cystostomy: Secondary | ICD-10-CM | POA: Diagnosis not present

## 2021-10-11 NOTE — Progress Notes (Signed)
Suprapubic Cath Change  Patient is present today for a suprapubic catheter change due to urinary retention.  70m of water was drained from the balloon, a 16FR Silastic  foley cath was removed from the tract without difficulty.  Site was cleaned and prepped in a sterile fashion with betadine.  A 18FR Silastic foley cath was replaced into the tract no complications were noted. Urine return was noted, 10 ml of sterile water was inflated into the balloon and a leg bag was attached for drainage.  Patient tolerated well.     Performed by: SDebroah Loop PA-C   Follow up: Return in about 4 weeks (around 11/08/2021) for 1 mo SPT exchange.

## 2021-11-08 ENCOUNTER — Ambulatory Visit (INDEPENDENT_AMBULATORY_CARE_PROVIDER_SITE_OTHER): Payer: Medicare Other | Admitting: Physician Assistant

## 2021-11-08 DIAGNOSIS — Z435 Encounter for attention to cystostomy: Secondary | ICD-10-CM

## 2021-11-08 DIAGNOSIS — R339 Retention of urine, unspecified: Secondary | ICD-10-CM | POA: Diagnosis not present

## 2021-11-08 NOTE — Progress Notes (Signed)
Suprapubic Cath Change  Patient is present today for a suprapubic catheter change due to urinary retention.  69m of water was drained from the balloon, a 18FR Silastic foley cath was removed from the tract without difficulty.  Site was cleaned and prepped in a sterile fashion with betadine.  A 18FR Silastic foley cath was replaced into the tract no complications were noted. Urine return was noted, 10 ml of sterile water was inflated into the balloon and a leg bag was attached for drainage.  Patient tolerated well.   Performed by: SDebroah Loop PA-C   Follow up: No follow-ups on file.

## 2021-11-29 IMAGING — CT CT IMAGE GUIDED DRAINAGE BY PERCUTANEOUS CATHETER
2 of 7 series · 8 of 42 positions shown, 13 images · non-contrast
Comparison: None.

INDICATION: 88-year-old with history of urethral stricture and urinary
retention. Request for suprapubic catheter placement

EXAM:
CT-GUIDED SUPRAPUBIC CATHETER PLACEMENT

[Series 3: i-spiral 5.0 mpr cor · coronal · 0.25mm/px · 3 of 66 slices shown]
[im 22/66  soft-tissue]
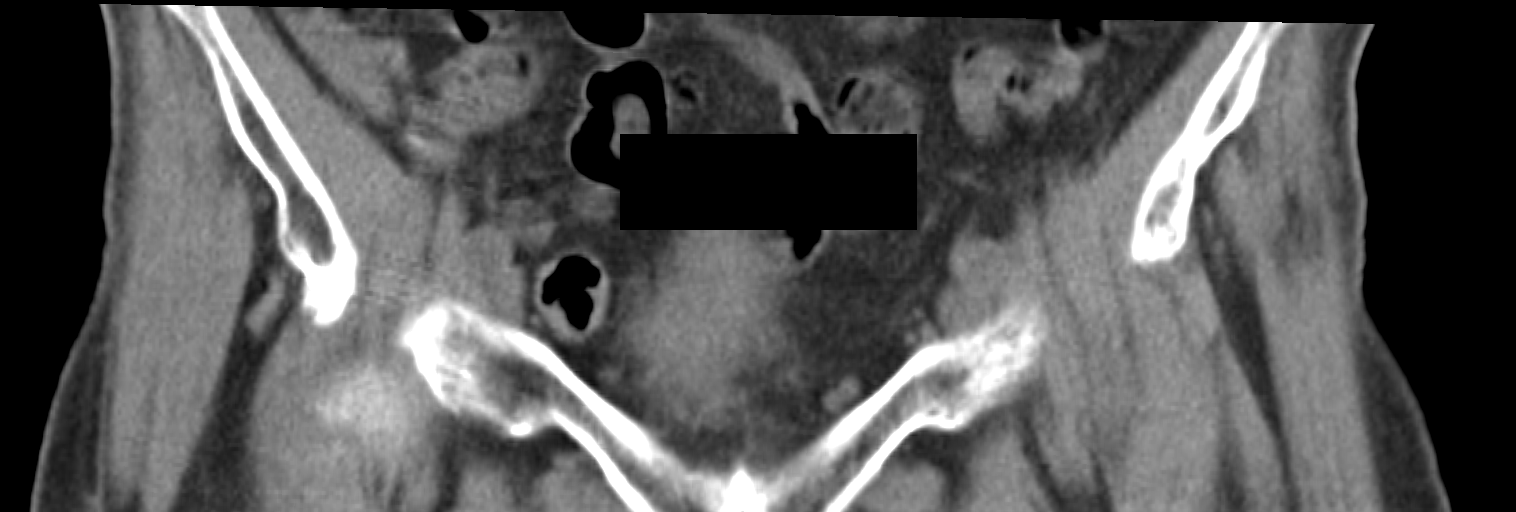
[im 29/66  soft-tissue]
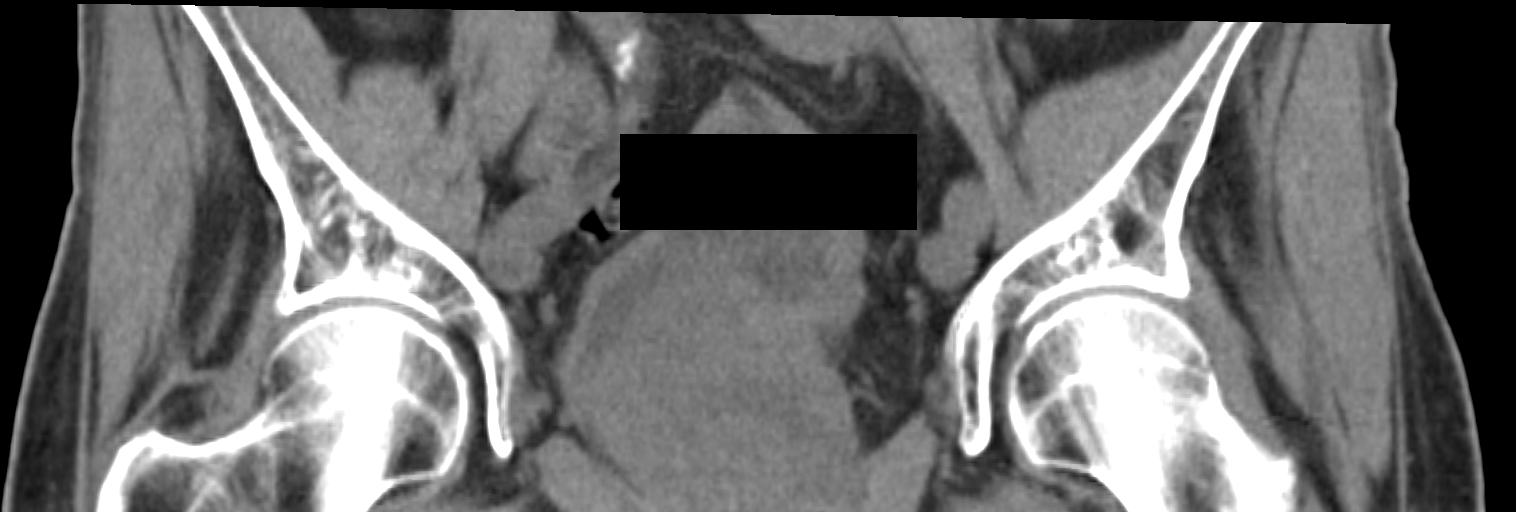
[im 37/66  soft-tissue]
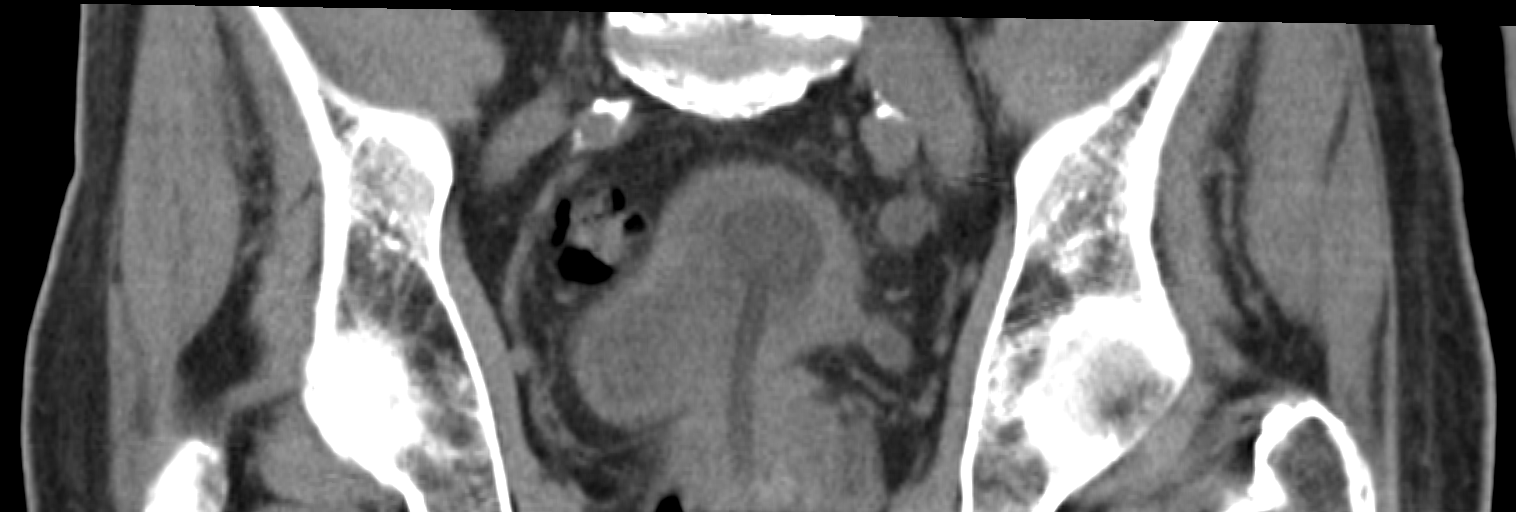

[Series 4: i-spiral 5.0 b30f · axial · 0.82mm/px · z∈[+1117,+1198]mm · 5 of 35 slices shown, 10 images]
[im 6/35  soft-tissue]
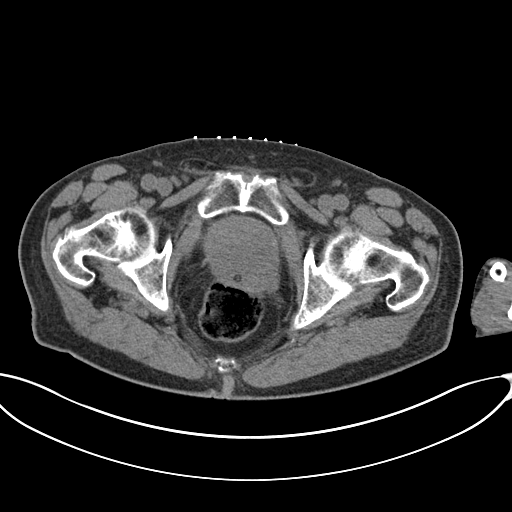
[im 6/35  bone]
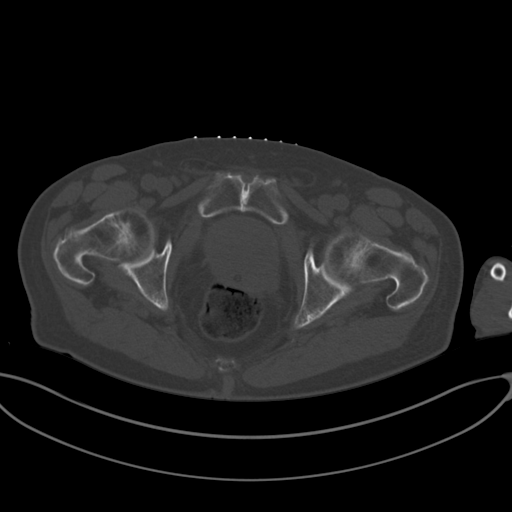
[im 12/35  soft-tissue]
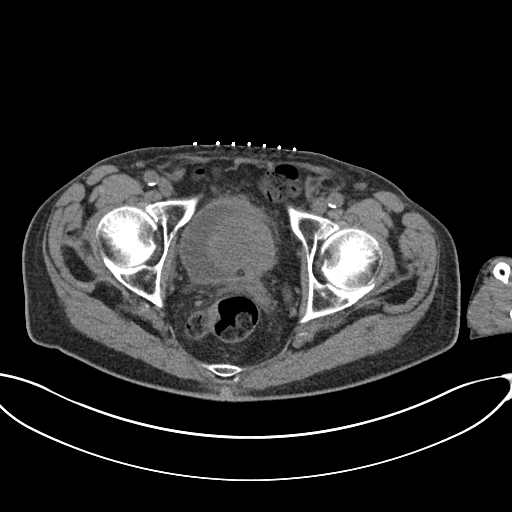
[im 12/35  lung]
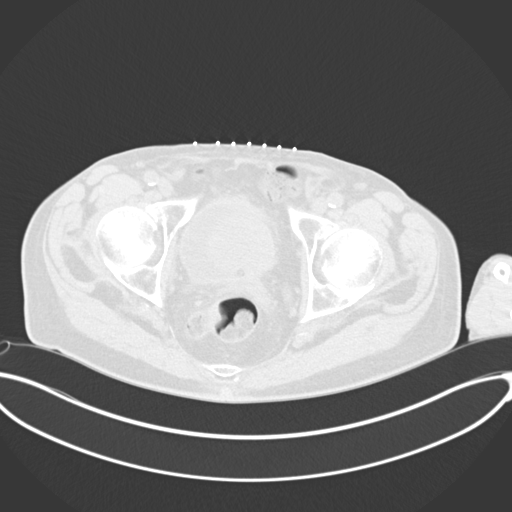
[im 18/35  soft-tissue]
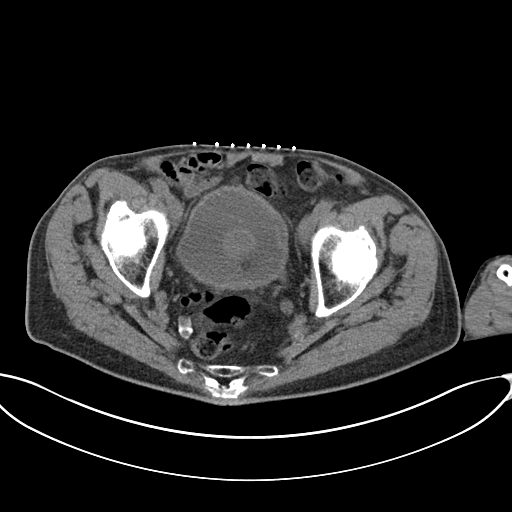
[im 18/35  lung]
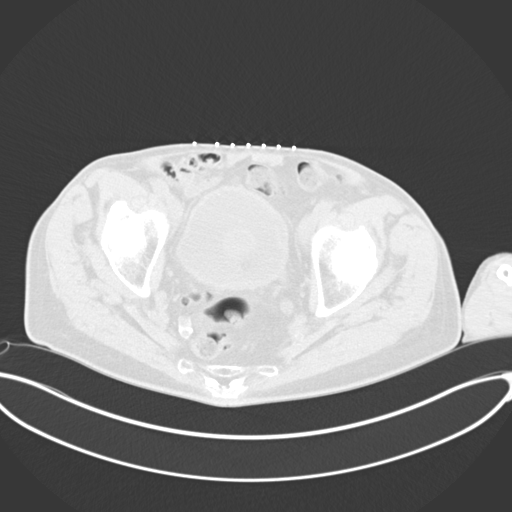
[im 23/35  soft-tissue]
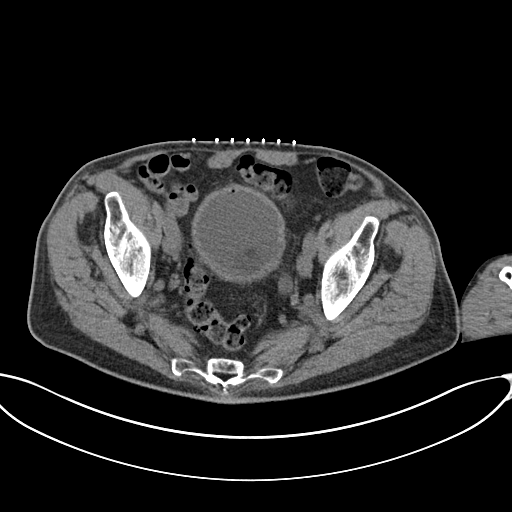
[im 23/35  lung]
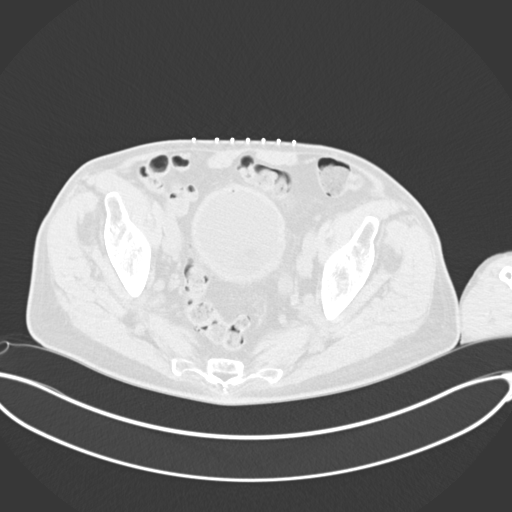
[im 29/35  soft-tissue]
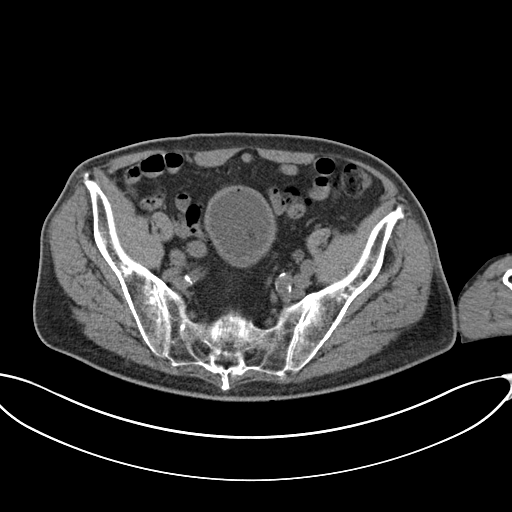
[im 29/35  lung]
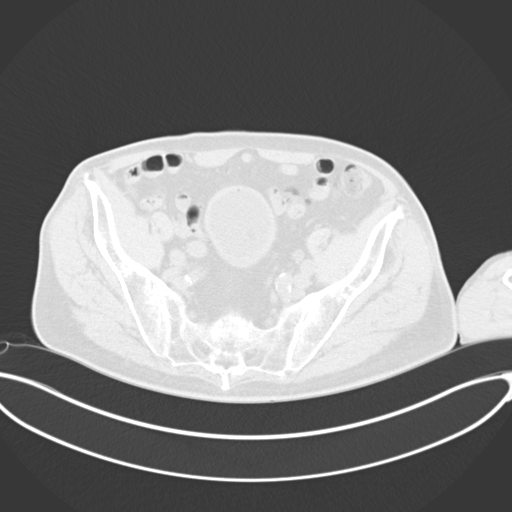

[8 of 42 positions shown; findings below may reference images not displayed]

MEDICATIONS:
Ciprofloxacin 400 mg; The antibiotic was administered in an
appropriate time frame prior to skin puncture.

ANESTHESIA/SEDATION:
Fentanyl 75 mcg IV; Versed 2.0 mg IV

Moderate Sedation Time:  49 minutes

The patient was continuously monitored during the procedure by the
interventional radiology nurse under my direct supervision.

CONTRAST:  None

FLUOROSCOPY TIME:  None

COMPLICATIONS:
None immediate.

PROCEDURE:
Informed written consent was obtained from the patient after a
thorough discussion of the procedural risks, benefits and
alternatives. All questions were addressed. A timeout was performed
prior to the initiation of the procedure.

Patient was placed supine on CT scanner. CT images through the lower
abdomen and pelvis were obtained. Approximately 200 mL of sterile
saline was infused through the patient's existing Foley catheter in
order to distend the urinary bladder and find a safe percutaneous
window for tube placement. Anterior abdomen was prepped with
chlorhexidine and sterile field was created. Maximal barrier sterile
technique was utilized including caps, mask, sterile gowns, sterile
gloves, sterile drape, hand hygiene and skin antiseptic. Skin and
soft tissues were anesthetized with 1% lidocaine. Using CT guidance,
an 18 gauge trocar needle was directed between loops of bowel and
directed into the anterior aspect of the urinary bladder. Bloody
urine was aspirated from the catheter. Super stiff Amplatz wire was
advanced into the bladder. Wire placement was confirmed with CT. The
tract was dilated to accommodate a 12 French multipurpose drain.
Blood tinged urine was aspirated. Catheter was sutured to skin and a
dressing was placed.
FINDINGS: Markedly enlarged prostate causing narrowing of the bladder lumen.
Foley catheter was present with inflated balloon. 12 French drain
was successfully placed within the urinary bladder and situated
between loops of bowel in the lower abdomen.
IMPRESSION: Successful placement of a CT-guided suprapubic catheter.

## 2021-12-08 ENCOUNTER — Ambulatory Visit: Payer: Medicare Other | Admitting: Physician Assistant

## 2021-12-08 ENCOUNTER — Encounter: Payer: Self-pay | Admitting: Physician Assistant

## 2021-12-08 VITALS — Ht 72.0 in | Wt 170.0 lb

## 2021-12-08 DIAGNOSIS — Z435 Encounter for attention to cystostomy: Secondary | ICD-10-CM

## 2021-12-08 DIAGNOSIS — R339 Retention of urine, unspecified: Secondary | ICD-10-CM | POA: Diagnosis not present

## 2021-12-08 NOTE — Progress Notes (Signed)
Suprapubic Cath Change  Patient is present today for a suprapubic catheter change due to urinary retention.  88m of water was drained from the balloon, a 18FR Silastic foley cath was removed from the tract without difficulty.  Site was cleaned and prepped in a sterile fashion with betadine.  A 18FR Silastic foley cath was replaced into the tract no complications were noted. Urine return was noted, 10 ml of sterile water was inflated into the balloon and a leg bag was attached for drainage.  Patient tolerated well.     Performed by: SDebroah Loop PA-C   Follow up: Return in about 4 weeks (around 01/05/2022) for SPT exchange.

## 2022-01-08 ENCOUNTER — Ambulatory Visit: Payer: Medicare Other | Admitting: Physician Assistant

## 2022-01-08 DIAGNOSIS — R339 Retention of urine, unspecified: Secondary | ICD-10-CM | POA: Diagnosis not present

## 2022-01-08 DIAGNOSIS — Z435 Encounter for attention to cystostomy: Secondary | ICD-10-CM | POA: Diagnosis not present

## 2022-01-08 NOTE — Progress Notes (Signed)
Suprapubic Cath Change  Patient is present today for a suprapubic catheter change due to urinary retention.  51m of water was drained from the balloon, a 18FR Silastic foley cath was removed from the tract without difficulty.  Site was cleaned and prepped in a sterile fashion with betadine.  A 18FR Silastic foley cath was replaced into the tract no complications were noted. Urine return was noted, 10 ml of sterile water was inflated into the balloon and a leg bag was attached for drainage.  Patient tolerated well.     Performed by: SDebroah Loop PA-C   Follow up: Return in about 4 weeks (around 02/05/2022) for SPT exchange.

## 2022-02-08 ENCOUNTER — Encounter: Payer: Medicare Other | Admitting: Physician Assistant

## 2022-02-09 ENCOUNTER — Encounter: Payer: Self-pay | Admitting: Physician Assistant

## 2022-02-09 ENCOUNTER — Ambulatory Visit: Payer: Medicare Other | Admitting: Physician Assistant

## 2022-02-09 VITALS — BP 173/99 | HR 106 | Ht 72.0 in | Wt 175.0 lb

## 2022-02-09 DIAGNOSIS — R339 Retention of urine, unspecified: Secondary | ICD-10-CM

## 2022-02-09 DIAGNOSIS — Z435 Encounter for attention to cystostomy: Secondary | ICD-10-CM

## 2022-02-09 NOTE — Progress Notes (Signed)
Suprapubic Cath Change  Patient is present today for a suprapubic catheter change due to urinary retention.  52m of water was drained from the balloon, a 18FR Silastic foley cath was removed from the tract without difficulty.  Site was cleaned and prepped in a sterile fashion with betadine.  A 18FR Silastic foley cath was replaced into the tract no complications were noted. Urine return was not noted and patient was counseled to RTC if no drainage within 2 hours, 10 ml of sterile water was inflated into the balloon and a leg bag was attached for drainage.  Patient tolerated well.     Performed by: SDebroah Loop PA-C   Follow up: Return in about 4 weeks (around 03/09/2022) for SPT exchange.

## 2022-02-17 ENCOUNTER — Other Ambulatory Visit: Payer: Self-pay

## 2022-02-17 ENCOUNTER — Emergency Department
Admission: EM | Admit: 2022-02-17 | Discharge: 2022-02-17 | Disposition: A | Discharge status: Home / Self Care | Payer: Medicare Other | Attending: Emergency Medicine | Admitting: Emergency Medicine

## 2022-02-17 DIAGNOSIS — N3000 Acute cystitis without hematuria: Secondary | ICD-10-CM | POA: Diagnosis not present

## 2022-02-17 DIAGNOSIS — Y833 Surgical operation with formation of external stoma as the cause of abnormal reaction of the patient, or of later complication, without mention of misadventure at the time of the procedure: Secondary | ICD-10-CM | POA: Diagnosis not present

## 2022-02-17 DIAGNOSIS — T83090A Other mechanical complication of cystostomy catheter, initial encounter: Secondary | ICD-10-CM | POA: Diagnosis present

## 2022-02-17 DIAGNOSIS — T83010A Breakdown (mechanical) of cystostomy catheter, initial encounter: Secondary | ICD-10-CM

## 2022-02-17 DIAGNOSIS — E039 Hypothyroidism, unspecified: Secondary | ICD-10-CM | POA: Insufficient documentation

## 2022-02-17 DIAGNOSIS — I10 Essential (primary) hypertension: Secondary | ICD-10-CM | POA: Insufficient documentation

## 2022-02-17 LAB — URINALYSIS, ROUTINE W REFLEX MICROSCOPIC
Bilirubin Urine: NEGATIVE
Glucose, UA: NEGATIVE mg/dL
Ketones, ur: NEGATIVE mg/dL
Nitrite: NEGATIVE
Protein, ur: 100 mg/dL — AB
Specific Gravity, Urine: 1.016 (ref 1.005–1.030)
Squamous Epithelial / HPF: NONE SEEN (ref 0–5)
WBC, UA: >50 WBC/hpf — ABNORMAL HIGH (ref 0–5)
pH: 7 (ref 5.0–8.0)

## 2022-02-17 MED ORDER — CEPHALEXIN 500 MG PO CAPS: 500.0000 mg | ORAL_CAPSULE | Freq: Two times a day (BID) | ORAL | 0 refills | Status: AC

## 2022-02-17 NOTE — ED Triage Notes (Signed)
Pt c/o pain. Pt is having issues with his suprapubic catheter. Yesterday pt noticed his catheter hasn't drained. Pt is also c/o severe pain. Pt is ambulatory. Pt caox 4, NAD.

## 2022-02-17 NOTE — ED Provider Notes (Signed)
[facility] Provider Note    Event Date/Time   First MD Initiated Contact with Patient 02/17/22 1324     (approximate)   History   catheter issues   HPI  Nicholas Grimes is a 88 y.o. male with history of hypothyroidism, hypertension, hyperlipidemia, urethral stricture requiring suprapubic catheter presents to the emergency department for treatment and evaluation of urinary retention.  Catheter has not drained since yesterday afternoon.  Catheter was last changed about 2 and half weeks ago.  Patient denies fever or back pain.     Physical Exam   Triage Vital Signs: ED Triage Vitals  Enc Vitals Group     BP 02/17/22 1256 (!) 149/97     Pulse Rate 02/17/22 1256 92     Resp 02/17/22 1256 18     Temp 02/17/22 1256 98.4 F (36.9 C)     Temp Source 02/17/22 1256 Oral     SpO2 02/17/22 1256 97 %     Weight 02/17/22 1257 170 lb (77.1 kg)     Height 02/17/22 1257 6' (1.829 m)     Head Circumference --      Peak Flow --      Pain Score 02/17/22 1257 10     Pain Loc --      Pain Edu? --      Excl. in [place]? --     Most recent vital signs: Vitals:   02/17/22 1256  BP: (!) 149/97  Pulse: 92  Resp: 18  Temp: 98.4 F (36.9 C)  SpO2: 97%     General: Awake, no distress.  CV:  Good peripheral perfusion.  Resp:  Normal effort.  Abd:  No distention.  Other:  Suprapubic tenderness.   ED Results / Procedures / Treatments   Labs (all labs ordered are listed, but only abnormal results are displayed) Labs Reviewed  URINALYSIS, ROUTINE W REFLEX MICROSCOPIC - Abnormal; Notable for the following components:      Result Value   Color, Urine AMBER (*)    APPearance TURBID (*)    Hgb urine dipstick MODERATE (*)    Protein, ur 100 (*)    Leukocytes,Ua LARGE (*)    WBC, UA >50 (*)    Bacteria, UA FEW (*)    All other components within normal limits     EKG  Not indicated.   RADIOLOGY   PROCEDURES:  Critical Care performed:  No  Procedures  Suprapubic catheter flushed with 60 mL normal saline then immediate return of yellow, malodorous urine with sediment.  MEDICATIONS ORDERED IN ED: Medications - No data to display   IMPRESSION / MDM / [place] / ED COURSE  I reviewed the triage vital signs and the nursing notes.                              Differential diagnosis includes, but is not limited to catheter malfunction; clogged catheter; catheter displacement  Patient's presentation is most consistent with acute, uncomplicated illness.  88 year old male presenting to the emergency department for evaluation of suprapubic catheter not draining since yesterday afternoon.  Catheter was flushed as described above with return of urine.  Leg bag changed out and urine specimen sent to lab.  Urinalysis consistent with UTI. Will treat with Keflex. Urine draining freely after being flushed. Patient to be discharged home with instructions to follow up with primary care or the urologist for concerns.  FINAL CLINICAL IMPRESSION(S) / ED DIAGNOSES   Final diagnoses:  Suprapubic catheter dysfunction, initial encounter ([place])  Acute cystitis without hematuria     Rx / DC Orders   ED Discharge Orders          Ordered    cephALEXin (KEFLEX) 500 MG capsule  2 times daily        02/17/22 1431             Note:  This document was prepared using Dragon voice recognition software and may include unintentional dictation errors.   Victorino Dike, FNP 02/17/22 1509    Lavonia Drafts, MD 02/17/22 [phone]

## 2022-03-16 ENCOUNTER — Encounter: Payer: Self-pay | Admitting: Urology

## 2022-03-16 ENCOUNTER — Ambulatory Visit: Payer: Medicare Other | Admitting: Urology

## 2022-03-16 VITALS — Ht 72.0 in | Wt 171.0 lb

## 2022-03-16 DIAGNOSIS — R339 Retention of urine, unspecified: Secondary | ICD-10-CM

## 2022-03-16 DIAGNOSIS — Z435 Encounter for attention to cystostomy: Secondary | ICD-10-CM

## 2022-03-16 NOTE — Progress Notes (Signed)
Patient ID: Nicholas Grimes, male   DOB: Mar 18, 1931, 88 y.o.   MRN: [id number] Suprapubic Cath Change  Patient is present today for a suprapubic catheter change due to urinary retention.  20m of water was drained from the balloon, a 18FR silastic foley cath was removed from the tract with out difficulty.  Site was cleaned and prepped in a sterile fashion with betadine.  A 18 silastic FR foley cath was replaced into the tract no complications were noted. Urine return was noted, 10 ml of sterile water was inflated into the balloon and a leg bag was attached for drainage.  Patient tolerated well. A night bag was given to patient and proper instruction was given on how to switch bags.    Performed by: DEdwin Dada CThe Villageand CElberta Leatherwood CMA  Follow up: 1-mth 04/17/2022

## 2022-04-17 ENCOUNTER — Ambulatory Visit (INDEPENDENT_AMBULATORY_CARE_PROVIDER_SITE_OTHER): Payer: Medicare Other | Admitting: Physician Assistant

## 2022-04-17 ENCOUNTER — Encounter: Payer: Self-pay | Admitting: Physician Assistant

## 2022-04-17 VITALS — Ht 72.0 in | Wt 171.0 lb

## 2022-04-17 DIAGNOSIS — R339 Retention of urine, unspecified: Secondary | ICD-10-CM | POA: Diagnosis not present

## 2022-04-17 DIAGNOSIS — Z435 Encounter for attention to cystostomy: Secondary | ICD-10-CM | POA: Diagnosis not present

## 2022-04-17 NOTE — Progress Notes (Signed)
Suprapubic Cath Change  Patient is present today for a suprapubic catheter change due to urinary retention.  14m of water was drained from the balloon, a 18FR Silastic foley cath was removed from the tract without difficulty.  Site was cleaned and prepped in a sterile fashion with betadine.  A 18FR Silastic foley cath was replaced into the tract no complications were noted. Urine return was not noted, 10 ml of sterile water was inflated into the balloon and a leg bag was attached for drainage.  Patient tolerated well. .    Performed by: SDebroah Loop PA-C   Follow up: Return in about 4 weeks (around 05/15/2022) for SPT exchange.

## 2022-05-22 ENCOUNTER — Encounter: Payer: Self-pay | Admitting: Physician Assistant

## 2022-05-22 ENCOUNTER — Ambulatory Visit (INDEPENDENT_AMBULATORY_CARE_PROVIDER_SITE_OTHER): Payer: Medicare Other | Admitting: Physician Assistant

## 2022-05-22 DIAGNOSIS — R339 Retention of urine, unspecified: Secondary | ICD-10-CM | POA: Diagnosis not present

## 2022-05-22 DIAGNOSIS — Z435 Encounter for attention to cystostomy: Secondary | ICD-10-CM

## 2022-05-22 NOTE — Progress Notes (Signed)
Suprapubic Cath Change  Patient is present today for a suprapubic catheter change due to urinary retention.  73m of water was drained from the balloon, a 18FR Silastic foley cath was removed from the tract without difficulty.  Site was cleaned and prepped in a sterile fashion with betadine.  A 18FR Silastic foley cath was replaced into the tract no complications were noted. Urine return was not noted, 10 ml of sterile water was inflated into the balloon and a leg bag was attached for drainage.  Patient tolerated well.     Performed by: SDebroah Loop PA-C   Follow up: Return in about 4 weeks (around 06/19/2022) for SPT exchange.

## 2022-06-20 ENCOUNTER — Ambulatory Visit (INDEPENDENT_AMBULATORY_CARE_PROVIDER_SITE_OTHER): Payer: Medicare Other | Admitting: Physician Assistant

## 2022-06-20 VITALS — BP 171/81 | HR 62 | Ht 72.0 in | Wt 171.0 lb

## 2022-06-20 DIAGNOSIS — Z435 Encounter for attention to cystostomy: Secondary | ICD-10-CM

## 2022-06-20 DIAGNOSIS — R339 Retention of urine, unspecified: Secondary | ICD-10-CM | POA: Diagnosis not present

## 2022-06-20 NOTE — Progress Notes (Signed)
Suprapubic Cath Change  Patient is present today for a suprapubic catheter change due to urinary retention.  49m of water was drained from the balloon, a 18FR Silastic foley cath was removed from the tract without difficulty.  Site was cleaned and prepped in a sterile fashion with betadine.  A 18FR Silastic foley cath was replaced into the tract no complications were noted. Urine return was not noted and patient was counseled to return to clinic if no drainage within 2 hours, 10 ml of sterile water was inflated into the balloon and a leg bag was attached for drainage.  Patient tolerated well.     Performed by: SDebroah Loop PA-C   Follow up: Return in about 4 weeks (around 07/18/2022) for SPT exchange.

## 2022-07-23 ENCOUNTER — Ambulatory Visit (INDEPENDENT_AMBULATORY_CARE_PROVIDER_SITE_OTHER): Payer: Medicare Other | Admitting: Physician Assistant

## 2022-07-23 VITALS — BP 166/70 | HR 118 | Ht 72.0 in | Wt 171.0 lb

## 2022-07-23 DIAGNOSIS — R339 Retention of urine, unspecified: Secondary | ICD-10-CM

## 2022-07-23 DIAGNOSIS — Z435 Encounter for attention to cystostomy: Secondary | ICD-10-CM | POA: Diagnosis not present

## 2022-07-23 NOTE — Progress Notes (Signed)
Suprapubic Cath Change  Patient is present today for a suprapubic catheter change due to urinary retention.  94ml of water was drained from the balloon, a 18FR Silastic foley cath was removed from the tract without difficulty.  Site was cleaned and prepped in a sterile fashion with betadine.  A 18FR Silastic foley cath was replaced into the tract no complications were noted. Urine return was noted, 10 ml of sterile water was inflated into the balloon and a leg bag was attached for drainage.  Patient tolerated well.     Performed by: Carman Ching, PA-C   Follow up: Return in about 4 weeks (around 08/20/2022) for SPT exchange.

## 2022-08-23 ENCOUNTER — Ambulatory Visit: Payer: Medicare Other | Admitting: Physician Assistant

## 2022-08-23 ENCOUNTER — Ambulatory Visit: Payer: Medicare Other | Admitting: Urology

## 2022-08-31 ENCOUNTER — Encounter: Payer: Self-pay | Admitting: Physician Assistant

## 2022-08-31 ENCOUNTER — Ambulatory Visit (INDEPENDENT_AMBULATORY_CARE_PROVIDER_SITE_OTHER): Payer: Medicare Other | Admitting: Physician Assistant

## 2022-08-31 VITALS — BP 185/111 | HR 108 | Ht 72.0 in

## 2022-08-31 DIAGNOSIS — R339 Retention of urine, unspecified: Secondary | ICD-10-CM | POA: Diagnosis not present

## 2022-08-31 DIAGNOSIS — Z435 Encounter for attention to cystostomy: Secondary | ICD-10-CM

## 2022-08-31 NOTE — Progress Notes (Signed)
Suprapubic Cath Change  Patient is present today for a suprapubic catheter change due to urinary retention.  8ml of water was drained from the balloon, a 18FR Silastic foley cath was removed from the tract without difficulty.  Site was cleaned and prepped in a sterile fashion with betadine.  A 18FR Silastic foley cath was replaced into the tract no complications were noted. Urine return was not noted, 10 ml of sterile water was inflated into the balloon and a leg bag was attached for drainage.  Patient tolerated well.     Performed by: Carman Ching, PA-C   Follow up: Return in about 4 weeks (around 09/28/2022) for SPT exchange.

## 2022-08-31 NOTE — Progress Notes (Signed)
Nicholas Grimes presents for an office visit. BP today is __167/80_________. He is complaint/ with BP medication-patient states he gets good b/p readings at home. Greater than 140/90. Provider  notified. Pt advised to___f/u with PCP___________. Pt voiced understanding.

## 2022-09-28 ENCOUNTER — Ambulatory Visit: Payer: Medicare Other

## 2022-10-02 ENCOUNTER — Ambulatory Visit (INDEPENDENT_AMBULATORY_CARE_PROVIDER_SITE_OTHER): Payer: Medicare Other | Admitting: Physician Assistant

## 2022-10-02 DIAGNOSIS — R339 Retention of urine, unspecified: Secondary | ICD-10-CM | POA: Diagnosis not present

## 2022-10-02 DIAGNOSIS — Z435 Encounter for attention to cystostomy: Secondary | ICD-10-CM | POA: Diagnosis not present

## 2022-10-02 NOTE — Progress Notes (Signed)
Suprapubic Cath Change  Patient is present today for a suprapubic catheter change due to urinary retention.  9ml of water was drained from the balloon, a 18 Silastic foley cath was removed from the tract with out difficulty.  Site was cleaned and prepped in a sterile fashion with betadine.  A 18 Silastic foley cath was replaced into the tract no complications were noted. Urine return was noted, 10 ml of sterile water was inflated into the balloon and a leg bag was attached for drainage.   Performed by: Teressa Lower, CMA  Follow up: 1 month SPT change

## 2022-11-01 ENCOUNTER — Encounter: Payer: Self-pay | Admitting: Physician Assistant

## 2022-11-01 ENCOUNTER — Ambulatory Visit (INDEPENDENT_AMBULATORY_CARE_PROVIDER_SITE_OTHER): Payer: Medicare Other | Admitting: Physician Assistant

## 2022-11-01 VITALS — BP 130/74 | HR 80 | Ht 72.0 in | Wt 171.0 lb

## 2022-11-01 DIAGNOSIS — R339 Retention of urine, unspecified: Secondary | ICD-10-CM

## 2022-11-01 DIAGNOSIS — Z435 Encounter for attention to cystostomy: Secondary | ICD-10-CM | POA: Diagnosis not present

## 2022-11-01 NOTE — Progress Notes (Signed)
Suprapubic Cath Change  Patient is present today for a suprapubic catheter change due to urinary retention.  8ml of water was drained from the balloon, a 18FR Silastic foley cath was removed from the tract without difficulty.  Site was cleaned and prepped in a sterile fashion with betadine.  A 18FR Silastic foley cath was replaced into the tract no complications were noted. Urine return was noted, 10 ml of sterile water was inflated into the balloon and a leg bag was attached for drainage.  Patient tolerated well.     Performed by: Carman Ching, PA-C   Follow up: Return in about 4 weeks (around 11/29/2022) for SPT exchange.

## 2022-12-03 ENCOUNTER — Ambulatory Visit (INDEPENDENT_AMBULATORY_CARE_PROVIDER_SITE_OTHER): Payer: Medicare Other | Admitting: Physician Assistant

## 2022-12-03 ENCOUNTER — Encounter: Payer: Self-pay | Admitting: Physician Assistant

## 2022-12-03 VITALS — BP 129/75 | HR 123 | Wt 175.0 lb

## 2022-12-03 DIAGNOSIS — R339 Retention of urine, unspecified: Secondary | ICD-10-CM

## 2022-12-03 DIAGNOSIS — Z435 Encounter for attention to cystostomy: Secondary | ICD-10-CM

## 2022-12-03 NOTE — Progress Notes (Addendum)
Suprapubic Cath Change  Patient is present today for a suprapubic catheter change due to urinary retention.  8ml of water was drained from the balloon, a 18FR Silastic foley cath was removed from the tract without difficulty.  Site was cleaned and prepped in a sterile fashion with betadine.  A 18FR Silastic foley cath was replaced into the tract no complications were noted. Urine return was noted, 10 ml of sterile water was inflated into the balloon and a leg bag was attached for drainage.  Patient tolerated well.    Performed by: Malcolm Metro RMA  Follow up: Return in about 4 weeks (around 12/31/2022) for SPT exchange.

## 2023-01-03 ENCOUNTER — Ambulatory Visit: Payer: Medicare Other | Admitting: Physician Assistant

## 2023-01-08 ENCOUNTER — Encounter: Payer: Self-pay | Admitting: Physician Assistant

## 2023-01-08 ENCOUNTER — Ambulatory Visit (INDEPENDENT_AMBULATORY_CARE_PROVIDER_SITE_OTHER): Payer: Medicare Other | Admitting: Physician Assistant

## 2023-01-08 VITALS — BP 160/83 | HR 89

## 2023-01-08 DIAGNOSIS — R339 Retention of urine, unspecified: Secondary | ICD-10-CM

## 2023-01-08 DIAGNOSIS — Z435 Encounter for attention to cystostomy: Secondary | ICD-10-CM

## 2023-01-08 NOTE — Progress Notes (Signed)
Suprapubic Cath Change  Patient is present today for a suprapubic catheter change due to urinary retention.  8ml of water was drained from the balloon, a 18FR Silastic foley cath was removed from the tract without difficulty.  Site was cleaned and prepped in a sterile fashion with betadine.  A 18FR Silastic foley cath was replaced into the tract no complications were noted. Urine return was not noted, 10 ml of sterile water was inflated into the balloon and a leg bag was attached for drainage.  Patient tolerated well.    Performed by: Carman Ching, PA-C   Follow up: Return in about 4 weeks (around 02/05/2023) for SPT exchange.

## 2023-02-04 ENCOUNTER — Ambulatory Visit (INDEPENDENT_AMBULATORY_CARE_PROVIDER_SITE_OTHER): Payer: Medicare Other | Admitting: Physician Assistant

## 2023-02-04 DIAGNOSIS — R339 Retention of urine, unspecified: Secondary | ICD-10-CM | POA: Diagnosis not present

## 2023-02-04 DIAGNOSIS — Z435 Encounter for attention to cystostomy: Secondary | ICD-10-CM

## 2023-02-04 NOTE — Progress Notes (Signed)
Suprapubic Cath Change  Patient is present today for a suprapubic catheter change due to urinary retention.  8ml of water was drained from the balloon, a 18FR Silastic foley cath was removed from the tract without difficulty.  Site was cleaned and prepped in a sterile fashion with betadine.  A 18FR Silastic foley cath was replaced into the tract no complications were noted. Urine return was noted, 10 ml of sterile water was inflated into the balloon and a leg bag was attached for drainage.  Patient tolerated well.    Performed by: Carman Ching, PA-C   Follow up: Return in about 4 weeks (around 03/04/2023) for SPT exchange.

## 2023-03-06 NOTE — Progress Notes (Unsigned)
Suprapubic Cath Change  Patient is present today for a suprapubic catheter change due to urinary retention.  8 ml of water was drained from the balloon, a 18 Silastic FR foley cath was removed from the tract with out difficulty.  Site was cleaned and prepped in a sterile fashion with betadine.  A 18 Silastic FR foley cath was replaced into the tract no complications were noted. Urine return was noted, 10 ml of sterile water was inflated into the balloon and a leg bag was attached for drainage.  Patient tolerated well. A night bag was given to patient and proper instruction was given on how to switch bags.    Performed by: Michiel Cowboy, PA-C   Follow up: Return in about 1 month (around 04/06/2023) for SPT exchange .

## 2023-03-07 ENCOUNTER — Ambulatory Visit (INDEPENDENT_AMBULATORY_CARE_PROVIDER_SITE_OTHER): Payer: Medicare Other | Admitting: Urology

## 2023-03-07 DIAGNOSIS — R339 Retention of urine, unspecified: Secondary | ICD-10-CM

## 2023-03-07 DIAGNOSIS — Z435 Encounter for attention to cystostomy: Secondary | ICD-10-CM

## 2023-04-04 ENCOUNTER — Ambulatory Visit: Payer: Medicare Other | Admitting: Urology

## 2023-04-04 VITALS — Ht 72.0 in | Wt 175.0 lb

## 2023-04-04 DIAGNOSIS — R339 Retention of urine, unspecified: Secondary | ICD-10-CM | POA: Diagnosis not present

## 2023-04-04 DIAGNOSIS — Z435 Encounter for attention to cystostomy: Secondary | ICD-10-CM

## 2023-04-04 NOTE — Progress Notes (Signed)
Patient ID: Nicholas Grimes, male   DOB: 1931/02/22, 89 y.o.   MRN: [id number] Cath Change/ Replacement  Patient is present today for a catheter change due to urinary retention.  8ml of water was removed from the balloon, a 18 silastic FR foley cath was removed without difficulty.  Patient was cleaned and prepped in a sterile fashion with betadine and 2% lidocaine jelly was instilled into the urethra. A 18 silastic  FR foley cath was replaced into the bladder, no complications were noted. Urine return was noted 3ml and urine was yellow in color. The balloon was filled with 10ml of sterile water. A leg bag was attached for drainage.  A night bag was also given to the patient and patient was given instruction on how to change from one bag to another. Patient was given proper instruction on catheter care.    Performed by: Mervin Hack, CMA  Follow up: 

## 2023-05-06 NOTE — Progress Notes (Unsigned)
Suprapubic Cath Change  Patient is present today for a suprapubic catheter change due to urinary retention.  8 ml of water was drained from the balloon, a 18 FR Silastic foley cath was removed from the tract with out difficulty.  Site was cleaned and prepped in a sterile fashion with betadine.  A 18 FR Silastic foley cath was replaced into the tract no complications were noted. Urine return was noted, 10 ml of sterile water was inflated into the balloon and a leg bag was attached for drainage.  Patient tolerated well. A night bag was given to patient and proper instruction was given on how to switch bags.    Performed by: Michiel Cowboy, PA-C   Follow up: Return in about 1 month (around 06/07/2023) for SPT exchange .

## 2023-05-07 ENCOUNTER — Ambulatory Visit (INDEPENDENT_AMBULATORY_CARE_PROVIDER_SITE_OTHER): Payer: Medicare Other | Admitting: Urology

## 2023-05-07 VITALS — BP 130/78 | HR 111

## 2023-05-07 DIAGNOSIS — R339 Retention of urine, unspecified: Secondary | ICD-10-CM

## 2023-05-07 DIAGNOSIS — Z435 Encounter for attention to cystostomy: Secondary | ICD-10-CM

## 2023-06-03 NOTE — Progress Notes (Unsigned)
 Suprapubic Cath Change  Patient is present today for a suprapubic catheter change due to urinary retention.  8 ml of water was drained from the balloon, a 18 FR Silastic foley cath was removed from the tract with out difficulty.  Site was cleaned and prepped in a sterile fashion with betadine.  A 18 FR Silastic foley cath was replaced into the tract no complications were noted. Urine return was noted, 10 ml of sterile water was inflated into the balloon and a leg bag was attached for drainage.  Patient tolerated well. A night bag was given to patient and proper instruction was given on how to switch bags.    Performed by: Michiel Cowboy, PA-C   Follow up: Return in about 1 month (around 07/03/2023) for SPT exchange .

## 2023-06-05 ENCOUNTER — Ambulatory Visit: Payer: Medicare Other | Admitting: Urology

## 2023-06-05 DIAGNOSIS — Z435 Encounter for attention to cystostomy: Secondary | ICD-10-CM

## 2023-06-05 DIAGNOSIS — R339 Retention of urine, unspecified: Secondary | ICD-10-CM | POA: Diagnosis not present

## 2023-07-01 NOTE — Progress Notes (Unsigned)
Error

## 2023-07-03 ENCOUNTER — Encounter: Payer: Self-pay | Admitting: Urology

## 2023-07-03 ENCOUNTER — Ambulatory Visit (INDEPENDENT_AMBULATORY_CARE_PROVIDER_SITE_OTHER): Payer: Medicare Other | Admitting: Urology

## 2023-07-03 VITALS — BP 173/93 | HR 120 | Ht 74.0 in | Wt 170.0 lb

## 2023-07-03 DIAGNOSIS — Z435 Encounter for attention to cystostomy: Secondary | ICD-10-CM | POA: Diagnosis not present

## 2023-08-06 ENCOUNTER — Ambulatory Visit (INDEPENDENT_AMBULATORY_CARE_PROVIDER_SITE_OTHER): Admitting: Physician Assistant

## 2023-08-06 DIAGNOSIS — Z435 Encounter for attention to cystostomy: Secondary | ICD-10-CM

## 2023-08-06 NOTE — Progress Notes (Addendum)
 Suprapubic Cath Change   Patient is present today for a suprapubic catheter change due to urinary retention.  8ml of water was drained from the balloon, a 18 FR foley cath was removed from the tract with out difficulty.  Site was cleaned and prepped in a sterile fashion with betadine.  A 18 FR Silastic foley cath was replaced into the tract no complications were noted. Urine return was noted, 10 ml of sterile water was inflated into the balloon and a Leg bag was attached for drainage.  Patient tolerated well. A night bag was given to patient and proper instruction was given on how to switch bags.     Performed by: Jenny Mohs, CMA   Follow up: Follow up in one month

## 2023-08-06 NOTE — Progress Notes (Deleted)
Error

## 2023-09-05 ENCOUNTER — Ambulatory Visit (INDEPENDENT_AMBULATORY_CARE_PROVIDER_SITE_OTHER): Admitting: Physician Assistant

## 2023-09-05 DIAGNOSIS — Z435 Encounter for attention to cystostomy: Secondary | ICD-10-CM

## 2023-09-05 NOTE — Progress Notes (Signed)
 Suprapubic Cath Change  Patient is present today for a suprapubic catheter change due to urinary retention.  8ml of water was drained from the balloon, a 18FR foley cath was removed from the tract with out difficulty.  Site was cleaned and prepped in a sterile fashion with betadine.  A 18FR Silastic foley cath was replaced into the tract no complications were noted. Urine return was noted, 10 ml of sterile water was inflated into the balloon and a leg bag was attached for drainage.  Patient tolerated well.    Performed by: Angelicia Lessner, PA-C   Follow up: Return in about 4 weeks (around 10/03/2023) for SPT exchange.

## 2023-10-07 ENCOUNTER — Ambulatory Visit (INDEPENDENT_AMBULATORY_CARE_PROVIDER_SITE_OTHER): Admitting: Physician Assistant

## 2023-10-07 VITALS — BP 107/55 | HR 56 | Ht 74.0 in | Wt 170.0 lb

## 2023-10-07 DIAGNOSIS — Z435 Encounter for attention to cystostomy: Secondary | ICD-10-CM

## 2023-10-07 DIAGNOSIS — L03311 Cellulitis of abdominal wall: Secondary | ICD-10-CM | POA: Diagnosis not present

## 2023-10-07 MED ORDER — SULFAMETHOXAZOLE-TRIMETHOPRIM 800-160 MG PO TABS
1.0000 | ORAL_TABLET | Freq: Two times a day (BID) | ORAL | 0 refills | Status: AC
Start: 2023-10-07 — End: 2023-10-14

## 2023-10-07 NOTE — Progress Notes (Signed)
 Suprapubic Cath Change  Patient is present today for a suprapubic catheter change due to urinary retention.  8ml of water was drained from the balloon, a 18FR Silastic foley cath was removed from the tract without difficulty.  Site was cleaned and prepped in a sterile fashion with betadine.  A 18FR Silastic foley cath was replaced into the tract no complications were noted. Urine return was noted, 10 ml of sterile water was inflated into the balloon and a leg bag was attached for drainage.  Patient tolerated well.    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 Would favor a more colonization picture, however his SPT tract has some surrounding edema and erythema suggestive of soft tissue infection. Will treat with empiric Bactrim DS BID x7 days.  Follow up: Return in about 4 weeks (around 11/04/2023) for SPT exchange.

## 2023-11-05 ENCOUNTER — Ambulatory Visit: Admitting: Physician Assistant

## 2023-11-15 DEATH — deceased
